# Patient Record
Sex: Female | Born: 1960 | Race: Black or African American | Hispanic: No | Smoking: Current every day smoker
Health system: Southern US, Community
[De-identification: ages and names within clinical notes are randomized; demographics above are authoritative.]

## PROBLEM LIST (undated history)

## (undated) DIAGNOSIS — I10 Essential (primary) hypertension: Secondary | ICD-10-CM

## (undated) DIAGNOSIS — I509 Heart failure, unspecified: Secondary | ICD-10-CM

## (undated) HISTORY — PX: APPENDECTOMY: SHX54

---

## 1998-07-24 ENCOUNTER — Emergency Department (HOSPITAL_COMMUNITY): Admission: EM | Admit: 1998-07-24 | Discharge: 1998-07-24 | Payer: Self-pay | Admitting: Emergency Medicine

## 1998-10-01 ENCOUNTER — Emergency Department (HOSPITAL_COMMUNITY): Admission: EM | Admit: 1998-10-01 | Discharge: 1998-10-01 | Payer: Self-pay | Admitting: Emergency Medicine

## 1998-11-05 ENCOUNTER — Encounter: Admission: RE | Admit: 1998-11-05 | Discharge: 1998-11-05 | Payer: Self-pay | Admitting: Internal Medicine

## 1999-03-19 ENCOUNTER — Encounter: Payer: Self-pay | Admitting: *Deleted

## 1999-03-19 ENCOUNTER — Emergency Department (HOSPITAL_COMMUNITY): Admission: EM | Admit: 1999-03-19 | Discharge: 1999-03-19 | Payer: Self-pay | Admitting: Emergency Medicine

## 2000-12-27 ENCOUNTER — Encounter: Payer: Self-pay | Admitting: Emergency Medicine

## 2000-12-27 ENCOUNTER — Inpatient Hospital Stay (HOSPITAL_COMMUNITY): Admission: EM | Admit: 2000-12-27 | Discharge: 2000-12-28 | Payer: Self-pay | Admitting: Emergency Medicine

## 2000-12-27 ENCOUNTER — Encounter: Payer: Self-pay | Admitting: Internal Medicine

## 2001-01-03 ENCOUNTER — Encounter: Admission: RE | Admit: 2001-01-03 | Discharge: 2001-01-03 | Payer: Self-pay | Admitting: Hematology and Oncology

## 2001-01-14 ENCOUNTER — Emergency Department (HOSPITAL_COMMUNITY): Admission: EM | Admit: 2001-01-14 | Discharge: 2001-01-14 | Payer: Self-pay | Admitting: Emergency Medicine

## 2001-01-27 ENCOUNTER — Emergency Department (HOSPITAL_COMMUNITY): Admission: EM | Admit: 2001-01-27 | Discharge: 2001-01-27 | Payer: Self-pay | Admitting: Emergency Medicine

## 2001-01-28 ENCOUNTER — Encounter: Payer: Self-pay | Admitting: Emergency Medicine

## 2001-02-01 ENCOUNTER — Encounter: Admission: RE | Admit: 2001-02-01 | Discharge: 2001-02-01 | Payer: Self-pay | Admitting: Hematology and Oncology

## 2001-02-02 ENCOUNTER — Ambulatory Visit (HOSPITAL_COMMUNITY): Admission: RE | Admit: 2001-02-02 | Discharge: 2001-02-02 | Payer: Self-pay | Admitting: *Deleted

## 2001-02-23 ENCOUNTER — Encounter: Admission: RE | Admit: 2001-02-23 | Discharge: 2001-02-23 | Payer: Self-pay | Admitting: Obstetrics

## 2001-05-12 ENCOUNTER — Emergency Department (HOSPITAL_COMMUNITY): Admission: EM | Admit: 2001-05-12 | Discharge: 2001-05-12 | Payer: Self-pay | Admitting: Emergency Medicine

## 2001-06-16 ENCOUNTER — Emergency Department (HOSPITAL_COMMUNITY): Admission: EM | Admit: 2001-06-16 | Discharge: 2001-06-16 | Payer: Self-pay | Admitting: Emergency Medicine

## 2001-10-12 ENCOUNTER — Emergency Department (HOSPITAL_COMMUNITY): Admission: EM | Admit: 2001-10-12 | Discharge: 2001-10-12 | Payer: Self-pay | Admitting: Emergency Medicine

## 2003-01-06 ENCOUNTER — Emergency Department (HOSPITAL_COMMUNITY): Admission: EM | Admit: 2003-01-06 | Discharge: 2003-01-06 | Payer: Self-pay | Admitting: Emergency Medicine

## 2003-01-06 ENCOUNTER — Encounter: Payer: Self-pay | Admitting: Emergency Medicine

## 2003-03-17 ENCOUNTER — Emergency Department (HOSPITAL_COMMUNITY): Admission: EM | Admit: 2003-03-17 | Discharge: 2003-03-17 | Payer: Self-pay

## 2003-07-22 ENCOUNTER — Encounter: Payer: Self-pay | Admitting: *Deleted

## 2003-07-22 ENCOUNTER — Emergency Department (HOSPITAL_COMMUNITY): Admission: EM | Admit: 2003-07-22 | Discharge: 2003-07-22 | Payer: Self-pay | Admitting: Emergency Medicine

## 2004-12-23 ENCOUNTER — Emergency Department (HOSPITAL_COMMUNITY): Admission: EM | Admit: 2004-12-23 | Discharge: 2004-12-23 | Payer: Self-pay | Admitting: Emergency Medicine

## 2005-02-02 ENCOUNTER — Ambulatory Visit: Payer: Self-pay | Admitting: Internal Medicine

## 2005-02-05 ENCOUNTER — Ambulatory Visit: Payer: Self-pay | Admitting: Family Medicine

## 2005-02-08 ENCOUNTER — Ambulatory Visit: Payer: Self-pay | Admitting: *Deleted

## 2005-02-26 ENCOUNTER — Ambulatory Visit: Payer: Self-pay | Admitting: Family Medicine

## 2006-08-08 ENCOUNTER — Emergency Department (HOSPITAL_COMMUNITY): Admission: EM | Admit: 2006-08-08 | Discharge: 2006-08-08 | Payer: Self-pay | Admitting: Emergency Medicine

## 2006-08-26 ENCOUNTER — Ambulatory Visit: Payer: Self-pay | Admitting: Family Medicine

## 2006-09-23 ENCOUNTER — Ambulatory Visit: Payer: Self-pay | Admitting: Family Medicine

## 2006-12-13 ENCOUNTER — Ambulatory Visit: Payer: Self-pay | Admitting: Family Medicine

## 2006-12-27 ENCOUNTER — Ambulatory Visit: Payer: Self-pay | Admitting: Family Medicine

## 2006-12-30 ENCOUNTER — Ambulatory Visit (HOSPITAL_COMMUNITY): Admission: RE | Admit: 2006-12-30 | Discharge: 2006-12-30 | Payer: Self-pay | Admitting: Family Medicine

## 2006-12-30 DIAGNOSIS — N83209 Unspecified ovarian cyst, unspecified side: Secondary | ICD-10-CM

## 2007-03-01 ENCOUNTER — Emergency Department (HOSPITAL_COMMUNITY): Admission: EM | Admit: 2007-03-01 | Discharge: 2007-03-01 | Payer: Self-pay | Admitting: Emergency Medicine

## 2007-03-03 ENCOUNTER — Ambulatory Visit: Payer: Self-pay | Admitting: Internal Medicine

## 2007-03-09 ENCOUNTER — Ambulatory Visit: Payer: Self-pay | Admitting: Internal Medicine

## 2007-07-27 ENCOUNTER — Telehealth (INDEPENDENT_AMBULATORY_CARE_PROVIDER_SITE_OTHER): Payer: Self-pay | Admitting: *Deleted

## 2007-07-28 DIAGNOSIS — I10 Essential (primary) hypertension: Secondary | ICD-10-CM | POA: Insufficient documentation

## 2007-07-28 DIAGNOSIS — F411 Generalized anxiety disorder: Secondary | ICD-10-CM | POA: Insufficient documentation

## 2007-08-01 ENCOUNTER — Ambulatory Visit: Payer: Self-pay | Admitting: Family Medicine

## 2007-08-03 DIAGNOSIS — L259 Unspecified contact dermatitis, unspecified cause: Secondary | ICD-10-CM

## 2007-08-16 ENCOUNTER — Encounter (INDEPENDENT_AMBULATORY_CARE_PROVIDER_SITE_OTHER): Payer: Self-pay | Admitting: *Deleted

## 2007-12-08 ENCOUNTER — Ambulatory Visit: Payer: Self-pay | Admitting: Nurse Practitioner

## 2007-12-08 DIAGNOSIS — I808 Phlebitis and thrombophlebitis of other sites: Secondary | ICD-10-CM

## 2007-12-13 ENCOUNTER — Emergency Department (HOSPITAL_COMMUNITY): Admission: EM | Admit: 2007-12-13 | Discharge: 2007-12-13 | Payer: Self-pay | Admitting: Emergency Medicine

## 2008-03-28 ENCOUNTER — Telehealth (INDEPENDENT_AMBULATORY_CARE_PROVIDER_SITE_OTHER): Payer: Self-pay | Admitting: *Deleted

## 2008-04-16 ENCOUNTER — Telehealth (INDEPENDENT_AMBULATORY_CARE_PROVIDER_SITE_OTHER): Payer: Self-pay | Admitting: Nurse Practitioner

## 2009-05-07 ENCOUNTER — Telehealth (INDEPENDENT_AMBULATORY_CARE_PROVIDER_SITE_OTHER): Payer: Self-pay | Admitting: Internal Medicine

## 2009-05-20 ENCOUNTER — Ambulatory Visit: Payer: Self-pay | Admitting: Nurse Practitioner

## 2009-05-20 DIAGNOSIS — IMO0002 Reserved for concepts with insufficient information to code with codable children: Secondary | ICD-10-CM

## 2009-05-20 DIAGNOSIS — F17200 Nicotine dependence, unspecified, uncomplicated: Secondary | ICD-10-CM

## 2009-05-20 LAB — CONVERTED CEMR LAB
ALT: 24 units/L (ref 0–35)
AST: 39 units/L — ABNORMAL HIGH (ref 0–37)
Albumin: 3.9 g/dL (ref 3.5–5.2)
Alkaline Phosphatase: 92 units/L (ref 39–117)
BUN: 6 mg/dL (ref 6–23)
Basophils Absolute: 0 10*3/uL (ref 0.0–0.1)
Calcium: 9 mg/dL (ref 8.4–10.5)
Cholesterol: 203 mg/dL — ABNORMAL HIGH (ref 0–200)
Creatinine, Ser: 0.74 mg/dL (ref 0.40–1.20)
HCT: 38 % (ref 36.0–46.0)
HDL: 92 mg/dL (ref >39)
Hemoglobin: 11.8 g/dL — ABNORMAL LOW (ref 12.0–15.0)
Lymphs Abs: 1.3 10*3/uL (ref 0.7–4.0)
Neutro Abs: 2.7 10*3/uL (ref 1.7–7.7)
Neutrophils Relative %: 55 % (ref 43–77)
Potassium: 4.6 meq/L (ref 3.5–5.3)
RBC: 4.53 M/uL (ref 3.87–5.11)
RDW: 15.5 % (ref 11.5–15.5)
TSH: 1.619 microintl units/mL (ref 0.350–4.500)
Total Bilirubin: 0.3 mg/dL (ref 0.3–1.2)
Triglycerides: 130 mg/dL (ref <150)
VLDL: 26 mg/dL (ref 0–40)
WBC: 4.9 10*3/uL (ref 4.0–10.5)

## 2009-05-21 ENCOUNTER — Encounter (INDEPENDENT_AMBULATORY_CARE_PROVIDER_SITE_OTHER): Payer: Self-pay | Admitting: Nurse Practitioner

## 2010-02-13 ENCOUNTER — Ambulatory Visit: Payer: Self-pay | Admitting: Nurse Practitioner

## 2010-02-13 LAB — CONVERTED CEMR LAB
Glucose, Urine, Semiquant: NEGATIVE
Rapid HIV Screen: NEGATIVE
Urobilinogen, UA: 0.2

## 2010-02-16 DIAGNOSIS — D649 Anemia, unspecified: Secondary | ICD-10-CM | POA: Insufficient documentation

## 2010-02-16 LAB — CONVERTED CEMR LAB
Basophils Relative: 1 % (ref 0–1)
CO2: 23 meq/L (ref 19–32)
Calcium: 8.7 mg/dL (ref 8.4–10.5)
HCT: 35.7 % — ABNORMAL LOW (ref 36.0–46.0)
HDL: 78 mg/dL (ref >39)
Hemoglobin: 11.1 g/dL — ABNORMAL LOW (ref 12.0–15.0)
LDL Cholesterol: 90 mg/dL (ref 0–99)
Lymphocytes Relative: 37 % (ref 12–46)
Lymphs Abs: 1.6 10*3/uL (ref 0.7–4.0)
MCV: 82.3 fL (ref 78.0–100.0)
Monocytes Absolute: 0.3 10*3/uL (ref 0.1–1.0)
Neutro Abs: 2.2 10*3/uL (ref 1.7–7.7)
Platelets: 167 10*3/uL (ref 150–400)
Potassium: 4.3 meq/L (ref 3.5–5.3)
RBC: 4.34 M/uL (ref 3.87–5.11)
Sodium: 140 meq/L (ref 135–145)
Total CHOL/HDL Ratio: 2.4
Triglycerides: 94 mg/dL (ref <150)
VLDL: 19 mg/dL (ref 0–40)
WBC: 4.3 10*3/uL (ref 4.0–10.5)

## 2010-02-27 ENCOUNTER — Ambulatory Visit: Payer: Self-pay | Admitting: Nurse Practitioner

## 2010-05-13 ENCOUNTER — Ambulatory Visit: Payer: Self-pay | Admitting: Nurse Practitioner

## 2010-05-13 LAB — CONVERTED CEMR LAB
GC Probe Amp, Genital: NEGATIVE
Glucose, Urine, Semiquant: NEGATIVE
KOH Prep: NEGATIVE
Ketones, urine, test strip: NEGATIVE
Protein, U semiquant: NEGATIVE
Specific Gravity, Urine: <1.005
WBC Urine, dipstick: NEGATIVE
pH: 5

## 2010-05-19 ENCOUNTER — Encounter (INDEPENDENT_AMBULATORY_CARE_PROVIDER_SITE_OTHER): Payer: Self-pay | Admitting: Nurse Practitioner

## 2010-05-19 LAB — CONVERTED CEMR LAB: Pap Smear: NEGATIVE

## 2010-05-28 ENCOUNTER — Ambulatory Visit (HOSPITAL_COMMUNITY): Admission: RE | Admit: 2010-05-28 | Discharge: 2010-05-28 | Payer: Self-pay | Admitting: Internal Medicine

## 2010-06-05 ENCOUNTER — Encounter (INDEPENDENT_AMBULATORY_CARE_PROVIDER_SITE_OTHER): Payer: Self-pay | Admitting: Nurse Practitioner

## 2010-06-16 ENCOUNTER — Encounter: Admission: RE | Admit: 2010-06-16 | Discharge: 2010-06-16 | Payer: Self-pay | Admitting: Internal Medicine

## 2010-12-29 NOTE — Assessment & Plan Note (Signed)
Summary: Complete Physical Exam   Vital Signs:  Patient profile:   50 year old female Menstrual status:  4 years ago Weight:      218.0 pounds BMI:     38.15 Temp:     98.0 degrees F oral Pulse rate:   92 / minute Pulse rhythm:   regular Resp:     20 per minute BP sitting:   154 / 103  (left arm) Cuff size:   regular  Vitals Entered By: Levon Hedger (May 13, 2010 2:41 PM)  Serial Vital Signs/Assessments:  Time      Position  BP       Pulse  Resp  Temp     By                     150/101  89                    Levon Hedger  CC: CPP, Hypertension Management Is Patient Diabetic? No Pain Assessment Patient in pain? no       Does patient need assistance? Functional Status Self care Ambulation Normal   Primary Care Provider:  Lehman Prom FNP  CC:  CPP and Hypertension Management.  History of Present Illness:  Pt into the office for complete physical exam  PAP - last pap smear several years ago All previous PAP Smears have been normal. 1 child age 69 Menses - post menopausal; lmp x 5 years ago  MAmmogram - no history of mammgram No self breast exams at home sister with breast cancer (younger)  Optho - no glasses or contacts.  last eye exam several years ago  Dental - no recent dental exam.  no current problems with teeth.  Social - pt is employed at [employer] in [employer]  tdap - last done over 10 years ago  Hypertension History:      She denies headache, chest pain, and palpitations.  She notes no problems with any antihypertensive medication side effects.  Pt is taking norvasc and toprol daily as ordered - she presents today with the meds.        Positive major cardiovascular risk factors include hypertension and current tobacco user.  Negative major cardiovascular risk factors include female age less than 23 years old.        Further assessment for target organ damage reveals no history of ASHD, cardiac end-organ damage (CHF/LVH), stroke/TIA,  peripheral vascular disease, renal insufficiency, or hypertensive retinopathy.     Habits & Providers  Alcohol-Tobacco-Diet     Alcohol drinks/day: 1     Alcohol Counseling: not indicated; use of alcohol is not excessive or problematic     Alcohol type: beer     Tobacco Status: current     Tobacco Counseling: to quit use of tobacco products     Cigarette Packs/Day: <0.25     Year Started: age 84  Exercise-Depression-Behavior     Does Patient Exercise: yes     Exercise Counseling: not indicated; exercise is adequate     Type of exercise: walking     Exercise (avg: min/session): <30     Times/week: 3     Have you felt down or hopeless? no     Have you felt little pleasure in things? no     Depression Counseling: not indicated; screening negative for depression     Drug Use: never  Comments: PHQ- 9 score = 9  Medications Prior to Update: 1)  Norvasc 10 Mg  Tabs (Amlodipine Besylate) .... Take 1 Tablet By Mouth Every Morning 2)  Toprol Xl 25 Mg Tb24 (Metoprolol Succinate) .... One Tablet By Mouth Daily For Blood Pressure 3)  Diprolene Af 0.05 %  Crea (Aug Betamethasone Dipropionate) .... Apply To Affected Areas Three Times A Day 4)  Multivitamins  Caps (Multiple Vitamin) .... One Capsule By Mouth Daily  Allergies (verified): 1)  ! Lotensin  Review of Systems General:  Denies fever. Eyes:  Denies blurring. ENT:  Denies earache. CV:  Denies chest pain or discomfort. Resp:  Denies cough. GI:  Denies abdominal pain, nausea, and vomiting. GU:  Denies dysuria. MS:  Denies joint pain. Derm:  Denies rash. Neuro:  Denies headaches. Psych:  Denies anxiety and depression.  Physical Exam  General:  alert.   Head:  normocephalic.   Eyes:  pupils round.   Ears:  R ear normal and L ear normal.  Bil TM with bony landmarks Nose:  no nasal discharge.   Mouth:  pharynx pink and moist and fair dentition.   Neck:  supple.   Chest Wall:  no mass.   Breasts:  pendulous no masses and  no abnormal thickening.   Lungs:  normal breath sounds.   Heart:  normal rate and regular rhythm.   Abdomen:  normal bowel sounds.   Rectal:  no external abnormalities.   Msk:  up to the exam room Pulses:  R radial normal and L radial normal.   Extremities:  no edema Neurologic:  gait normal.   Skin:  discolored toenails Psych:  Oriented X3.    Pelvic Exam  Vulva:      normal appearance.   Urethra and Bladder:      Urethra--no discharge.   Vagina:      physiologic discharge.   Cervix:      midposition.   Uterus:      smooth.   Adnexa:      nontender bilaterally.   Rectum:      heme negative stool.      Impression & Recommendations:  Problem # 1:  ROUTINE GYNECOLOGICAL EXAMINATION (ICD-V72.31) labs reviewed from previous visit PAP done PHQ-9 score = 9 EKG done guaiac negative tdap given Orders: UA Dipstick w/o Micro (manual) ([address]) KOH/ WET Mount [phone]) Pap Smear, Thin Prep ( Collection of) (Q0091) T- GC Chlamydia ([address]) Hemoccult Guaiac-1 spec.(in office) (82270)  Problem # 2:  UNSPECIFIED BREAST SCREENING (ICD-V76.10) self breast exam reviewed with pt mammogram scheduled sister with hx of breast cancer Orders: Mammogram (Screening) (Mammo)  Problem # 3:  HYPERTENSION (ICD-401.9) still elevated - will need to increase dose of toprol to 50mg   Her updated medication list for this problem includes:    Norvasc 10 Mg Tabs (Amlodipine besylate) .Marland Kitchen... Take 1 tablet by mouth every morning    Toprol Xl 50 Mg Xr24h-tab (Metoprolol succinate) ..... One tablet by mouth daily for blood pressure **note change in dose**  Orders: EKG w/ Interpretation (93000)  Problem # 4:  ANEMIA (ICD-285.9) noted during last visit pt has stated iron tablets as ordered  Complete Medication List: 1)  Norvasc 10 Mg Tabs (Amlodipine besylate) .... Take 1 tablet by mouth every morning 2)  Toprol Xl 50 Mg Xr24h-tab (Metoprolol succinate) .... One tablet by mouth daily for blood  pressure **note change in dose** 3)  Diprolene Af 0.05 % Crea (Aug betamethasone dipropionate) .... Apply to affected areas three times a day 4)  Multivitamins Caps (Multiple vitamin) .... One  capsule by mouth daily  Other Orders: Tdap => 14yrs IM ([address]) Admin 1st Vaccine (60[address]) Admin 1st Vaccine [facility]) [phone])  Hypertension Assessment/Plan:      The patient's hypertensive risk group is category B: At least one risk factor (excluding diabetes) with no target organ damage.  Her calculated 10 year risk of coronary heart disease is 5 %.  Today's blood pressure is 154/103.  Her blood pressure goal is < 140/90.  Patient Instructions: 1)  Blood pressure - still elevated 2)  Increase toprol to 50mg  by mouth daily 3)  (you can take 2 of the tablets by mouth daily until you complete the supply) 4)  Continue to take norvasc 5)  Follow up in 1 month for blood pressure check. 6)  Take your medications before this visit Prescriptions: TOPROL XL 50 MG XR24H-TAB (METOPROLOL SUCCINATE) One tablet by mouth daily for blood pressure **note change in dose**  #30 x 2   Entered and Authorized by:   Lehman Prom FNP   Signed by:   Lehman Prom FNP on 05/13/2010   Method used:   Faxed to ...       [facility] (retail)       [address] [place], [place]  [address]       Ph: [phone]       Fax: [phone]   RxID:   [id number]   Prevention & Chronic Care Immunizations   Influenza vaccine: Not documented    Tetanus booster: 05/13/2010: Tdap   Tetanus booster due: 05/13/2020    Pneumococcal vaccine: Not documented  Other Screening   Pap smear: Not documented   Pap smear action/deferral: Ordered  (05/13/2010)   Pap smear due: 05/14/2011    Mammogram: Not documented   Mammogram action/deferral: Ordered  (05/13/2010)   Smoking status: current  (05/13/2010)   Smoking cessation counseling: no  (05/13/2010)  Lipids   Total  Cholesterol: 187  (02/13/2010)   LDL: 90  (02/13/2010)   LDL Direct: Not documented   HDL: 78  (02/13/2010)   Triglycerides: 94  (02/13/2010)  Hypertension   Last Blood Pressure: 154 / 103  (05/13/2010)   Serum creatinine: 0.63  (02/13/2010)   Serum potassium 4.3  (02/13/2010)  Self-Management Support :    Hypertension self-management support: Not documented   Nursing Instructions: Give tetanus booster today     EKG  Procedure date:  05/13/2010  Findings:      NSR LVH   Tetanus/Td Vaccine    Vaccine Type: Tdap    Site: left deltoid    Mfr: Sanofi Pasteur    Dose: 0.5 ml    Route: IM    Given by: Levon Hedger    Exp. Date: 09/30/2012    Lot #: [id number]    VIS given: 10/17/07 version given May 13, 2010.   Laboratory Results   Urine Tests  Date/Time Received: May 13, 2010 3:53 PM  Date/Time Reported: May 13, 2010 3:53 PM   Routine Urinalysis   Color: lt. yellow Appearance: Clear Glucose: negative   (Normal Range: Negative) Bilirubin: negative   (Normal Range: Negative) Ketone: negative   (Normal Range: Negative) Spec. Gravity: <1.005   (Normal Range: 1.003-1.035) Blood: trace-intact   (Normal Range: Negative) pH: 5.0   (Normal Range: 5.0-8.0) Protein: negative   (Normal Range: Negative) Urobilinogen: 0.2   (Normal Range: 0-1) Nitrite: negative   (Normal Range: Negative) Leukocyte Esterace: negative   (Normal Range: Negative)  Date/Time Received: May 13, 2010 4:02 PM   Wet Mount/KOH Source: vaginal WBC/hpf: 1-5 Bacteria/hpf: rare Clue cells/hpf: none Yeast/hpf: none Trichomonas/hpf: none  Stool - Occult Blood Hemmoccult #1: negative Date: 05/13/2010   Laboratory Results   Urine Tests    Routine Urinalysis   Color: lt. yellow Appearance: Clear Glucose: negative   (Normal Range: Negative) Bilirubin: negative   (Normal Range: Negative) Ketone: negative   (Normal Range: Negative) Spec. Gravity: <1.005   (Normal Range:  1.003-1.035) Blood: trace-intact   (Normal Range: Negative) pH: 5.0   (Normal Range: 5.0-8.0) Protein: negative   (Normal Range: Negative) Urobilinogen: 0.2   (Normal Range: 0-1) Nitrite: negative   (Normal Range: Negative) Leukocyte Esterace: negative   (Normal Range: Negative)      Wet Mount Wet Mount KOH: Negative  Stool - Occult Blood Hemmoccult #1: negative

## 2010-12-29 NOTE — Progress Notes (Signed)
Summary: Office Visit//DEPRESSION SCREENNING  Office Visit//DEPRESSION SCREENNING   Imported By: Arta Bruce 05/14/2010 10:31:15  _____________________________________________________________________  External Attachment:    Type:   Image     Comment:   External Document

## 2010-12-29 NOTE — Assessment & Plan Note (Signed)
Summary: HTN   Vital Signs:  Patient profile:   50 year old female Menstrual status:  4 years ago Weight:      218.9 pounds BMI:     38.31 BSA:     2.02 Temp:     98.2 degrees F oral Pulse rate:   99 / minute Pulse rhythm:   regular Resp:     20 per minute BP sitting:   141 / 96  (left arm) Cuff size:   regular  Vitals Entered By: Levon Hedger (February 13, 2010 12:22 PM) CC: renew medication, Hypertension Management Is Patient Diabetic? No Pain Assessment Patient in pain? no       Does patient need assistance? Functional Status Self care Ambulation Normal   Primary Care Provider:  Julieanne Manson MD  CC:  renew medication and Hypertension Management.  History of Present Illness:  Pt into the office for high blood pressure. Pt has not taken her medications in over 6 months.  Social - Pt is employed in [employer] at [employer]. Admits that she has changed her diet to decrease processed food and she has also startded to walk and exercise.  No acute complaints today  Hypertension History:      She denies headache, chest pain, and palpitations.  She notes no problems with any antihypertensive medication side effects.  pt has not taken her medications in over 6 months.        Positive major cardiovascular risk factors include hypertension and current tobacco user.  Negative major cardiovascular risk factors include female age less than 45 years old.        Further assessment for target organ damage reveals no history of ASHD, cardiac end-organ damage (CHF/LVH), stroke/TIA, peripheral vascular disease, renal insufficiency, or hypertensive retinopathy.     Habits & Providers  Alcohol-Tobacco-Diet     Alcohol drinks/day: 1     Alcohol Counseling: not indicated; use of alcohol is not excessive or problematic     Alcohol type: beer     Tobacco Status: current     Tobacco Counseling: to quit use of tobacco products     Cigarette Packs/Day: <0.25     Year Started: age  12  Exercise-Depression-Behavior     Does Patient Exercise: yes     Exercise Counseling: not indicated; exercise is adequate     Type of exercise: walking     Exercise (avg: min/session): <30     Times/week: 3     Drug Use: never  Medications Prior to Update: 1)  Norvasc 10 Mg  Tabs (Amlodipine Besylate) .... Take 1 Tablet By Mouth Every Morning 2)  Toprol Xl 25 Mg Tb24 (Metoprolol Succinate) .... One Tablet By Mouth Daily For Blood Pressure 3)  Diprolene Af 0.05 %  Crea (Aug Betamethasone Dipropionate) .... Apply To Affected Areas Three Times A Day  Allergies (verified): 1)  ! Lotensin  Review of Systems General:  Denies fever. CV:  Denies chest pain or discomfort. Resp:  Denies cough. GI:  Denies abdominal pain, nausea, and vomiting.  Physical Exam  General:  alert.   Lungs:  normal breath sounds.   Heart:  normal rate and regular rhythm.   Abdomen:  normal bowel sounds.   Msk:  up to the exam table Neurologic:  alert & oriented X3.   Skin:  color normal.   Psych:  Oriented X3.     Impression & Recommendations:  Problem # 1:  HYPERTENSION (ICD-401.9) Continue DASH diet will restart norvasc 10mg   by mouth daily Her updated medication list for this problem includes:    Norvasc 10 Mg Tabs (Amlodipine besylate) .Marland Kitchen... Take 1 tablet by mouth every morning    Toprol Xl 25 Mg Tb24 (Metoprolol succinate) ..... Hold  Orders: T-Lipid Profile [phone]) T-Comprehensive Metabolic Panel [phone]) T-CBC w/Diff [phone]) Rapid HIV  ([address]) T-TSH [phone]) T-Syphilis Test (RPR) [phone]) T-Urine Microalbumin w/creat. ratio [phone]) UA Dipstick w/o Micro (manual) ([address])  Problem # 2:  TOBACCO ABUSE (ICD-305.1) advised cessation  Complete Medication List: 1)  Norvasc 10 Mg Tabs (Amlodipine besylate) .... Take 1 tablet by mouth every morning 2)  Toprol Xl 25 Mg Tb24 (Metoprolol succinate) .... Hold 3)  Diprolene Af 0.05 % Crea (Aug betamethasone  dipropionate) .... Apply to affected areas three times a day  Hypertension Assessment/Plan:      The patient's hypertensive risk group is category B: At least one risk factor (excluding diabetes) with no target organ damage.  Her calculated 10 year risk of coronary heart disease is 4 %.  Today's blood pressure is 141/96.  Her blood pressure goal is < 140/90.  Patient Instructions: 1)  Since your blood pressure is doing well you can restart on the norvasc 10mg .  Pick this up from the pharmacy on next week. 2)  Keep the toprol on hold at the pharmacy - you will not need it until told after your blood pressure check. 3)  Keep up the good work with diet and exercise as this is doing well for your blood pressure 4)  Follow up in 2 weeks for nurse visit - blood pressure check. 5)  If blood pressure is still ok then you can keep taking the norvasc. 6)  Schedule appointment in 6 weeks for a complete physical exam. Prescriptions: TOPROL XL 25 MG TB24 (METOPROLOL SUCCINATE) One tablet by mouth daily for blood pressure  #30 x 3   Entered and Authorized by:   Lehman Prom FNP   Signed by:   Lehman Prom FNP on 02/13/2010   Method used:   Faxed to ...       [facility] (retail)       [address] [place], [place]  [address]       Ph: [phone]       Fax: [phone]   RxID:   [id number] NORVASC 10 MG  TABS (AMLODIPINE BESYLATE) Take 1 tablet by mouth every morning  #30 x 3   Entered and Authorized by:   Lehman Prom FNP   Signed by:   Lehman Prom FNP on 02/13/2010   Method used:   Faxed to ...       [facility] (retail)       [address] [place], [place]  [address]       Ph: [phone]       Fax: [phone]   RxID:   [id number]   Laboratory Results   Urine Tests  Date/Time Received: February 13, 2010 12:46 PM  Date/Time Reported: February 13, 2010 12:46 PM   Routine  Urinalysis   Color: lt. yellow Appearance: Clear Glucose: negative   (Normal Range: Negative) Bilirubin: negative   (Normal Range: Negative) Ketone: negative   (Normal Range: Negative) Spec. Gravity: <1.005   (Normal Range: 1.003-1.035) Blood: negative   (Normal Range: Negative) pH: 5.5   (Normal Range: 5.0-8.0) Protein: negative   (Normal Range: Negative) Urobilinogen: 0.2   (  Normal Range: 0-1) Nitrite: negative   (Normal Range: Negative) Leukocyte Esterace: negative   (Normal Range: Negative)    Date/Time Received: February 13, 2010 12:57 PM   Other Tests  Rapid HIV: negative

## 2010-12-29 NOTE — Letter (Signed)
Summary: *HSN Results Follow up  [facility]  [address] [place], [place] [address]   Phone: [phone]  Fax: [phone]      05/19/2010   Patricia Mejia [address] [place], [place]  [address]   Dear  [name],                            ____S.Drinkard,FNP   ____D. Gore,FNP       ____B. McPherson,MD   ____V. Rankins,MD    ____E. Mulberry,MD    __X__N. Daphine Deutscher, FNP  ____D. Reche Dixon, MD    ____K. Philipp Deputy, MD    ____Other     This letter is to inform you that your recent test(s):  ___X____Pap Smear    _______Lab Test     _______X-ray    ___X____ is within acceptable limits  _______ requires a medication change  _______ requires a follow-up lab visit  _______ requires a follow-up visit with your provider   Comments: Pap Smear results are normal.       _________________________________________________________ If you have any questions, please contact our office [phone].                    Sincerely,    Lehman Prom FNP [facility]

## 2011-01-28 ENCOUNTER — Telehealth (INDEPENDENT_AMBULATORY_CARE_PROVIDER_SITE_OTHER): Payer: Self-pay | Admitting: Nurse Practitioner

## 2011-01-28 ENCOUNTER — Inpatient Hospital Stay (INDEPENDENT_AMBULATORY_CARE_PROVIDER_SITE_OTHER)
Admission: RE | Admit: 2011-01-28 | Discharge: 2011-01-28 | Disposition: A | Discharge status: Home / Self Care | Payer: Self-pay | Source: Ambulatory Visit | Attending: Family Medicine | Admitting: Family Medicine

## 2011-01-28 DIAGNOSIS — K5289 Other specified noninfective gastroenteritis and colitis: Secondary | ICD-10-CM

## 2011-01-28 DIAGNOSIS — R112 Nausea with vomiting, unspecified: Secondary | ICD-10-CM

## 2011-02-04 NOTE — Progress Notes (Signed)
Summary: Need note for work today  Phone Note Call from Patient Call back at [facility] Phone [phone]   Reason for Call: Talk to Nurse Summary of Call: Pt said she didn't go to work yesterday because she had like a 24 hr virus and her job told her she needs a note today to come to work.  Pt goes to work at [employer] today. pls call asap so pt can stop by to get note and go to work. Initial call taken by: Ayesha Rumpf,  January 28, 2011 9:33 AM  Follow-up for Phone Call        Pt. advised that we cannot give her a work excuse because she was not seen in our office.  Pt. verbalized understanding.  Dutch Quint RN  January 28, 2011 9:36 AM

## 2011-04-16 NOTE — Discharge Summary (Signed)
[place]  Patient:    Patricia Mejia, Patricia Mejia                      MRN: [id number] Adm. Date:  [id number] Disc. Date: [id number] Attending:  Farley Mejia Dictator:   Patricia Mejia, M.D. CC:         Patricia Mejia, M.D.  Patricia Mejia, M.D.  Patricia Mejia, M.D.   Discharge Summary  DISCHARGE DIAGNOSES: 1. Anemia secondary to uterine fibroids and excessive menstrual bleeding. 2. Hypertension.  DISCHARGE MEDICATIONS: 1. HCTZ 25 mg p.o. q.d. 2. Iron 325 mg p.o. t.i.d.  FOLLOW-UP APPOINTMENT:  1. Patricia Mejia was to follow up with the outpatient clinic on Tuesday, January 03, 2001, for recheck of a CBC.  2. She was to follow up with the OB/GYN clinic on Tuesday, January 10, 2001, at 2 p.m. for further evaluation and work-up of her anemia and fibroids.  3. She was to follow up with Patricia Mejia, M.D., on February 01, 2001, at 9 a.m. for regular medical issue follow-up.  PROCEDURES:  Ultrasound of the pelvis and transvaginal ultrasound were done on December 27, 2000, and revealed a moderately enlarged fibroid uterus, a normal left ovary, and two abutting complex cystic lesions of the right ovary with a moderate amount of fluid in the cul-de-sac.  CONSULTATIONS:  None.  CHIEF COMPLAINT:  Shortness of breath and palpitations.  HISTORY OF PRESENT ILLNESS:  This is a 50 year old African-American female with a past medical history of hypertension and fibroids, who presents with a one-day history of shortness of breath, dizziness, and palpitations. Patricia Mejia reports that she awoke suddenly last night, feeling short of breath, dizzy, and felt that her heart was racing.  She denies a prior history of similar symptoms.  She presented to the ER, was transfused, and reports a decrease in her symptoms since transfusion.  She does still complain of some shortness of breath and intermittent palpitations and did have an episode of chills with  transfusion.  She does state a history of increased menstrual bleeding for the past three to four years.  She states that her period occur approximately one time per month, but last about nine days.  She reports that it is heavy through the sixth day and that she uses about a pack of pads per day.  She denies pain.  REVIEW OF SYSTEMS:  Negative for fevers or chills, double or blurred vision, hearing changes, chest pain, nausea, vomiting, diarrhea, constipation, bright red blood per rectum, melena, and dysuria.  The review of systems is positive for a nonproductive cough x 2 days.  PAST MEDICAL HISTORY:  1. Hypertension.  2. Uterine fibroids.  PAST SURGICAL HISTORY:  Appendectomy.  ALLERGIES:  No known drug allergies.  MEDICATIONS:  Hydrochlorothiazide 25 mg q.d. (stopped four months ago).  SOCIAL HISTORY:  Pat[place] with her sister, Patricia Mejia.  She is separated with one daughter.  She smokes tobacco prior to quitting three days ago, smoking one pack a day x 15 years.  She drinks about two cans of beer a day.  She denies illicit drug use.  FAMILY MEDICAL HISTORY:  Patricia Mejia died with an MI at 50 years of age.  Patricia Mejia also had hypertension.  Patricia Mejia is alive at 50 years of age, status post CVA.  He also has diabetes.  Patricia Mejia has three sisters, one with breast cancer.  PHYSICAL EXAMINATION:  On admission, vital signs  were temperature 99.3 degrees, pulse 108, respiratory rate 22, BP supine 155/92 and pulse 113, BP sitting 150/87 and pulse 111, and BP standing 149/89 and pulse 116.  On the floor, vital signs were temperature 100.3 degrees, BP 160/90, respiratory rate 20, saturations 95% on room air, and pulse 119.  GENERAL APPEARANCE:  This is a pleasant African-American female in no acute distress.  HEENT:  Pupils are equally, round, and reactive to light.  Extraocular muscles intact.  Oropharynx clear without erythema or exudate.  Conjunctivae  pale bilaterally.  NECK:  No cervical lymphadenopathy.  CARDIOVASCULAR:  Regular rate and rhythm.  Tachycardic with a 3/6 systolic ejection murmur heard throughout at the left and right second intercostal spaces, fourth left intercostal space, and the apex.  RESPIRATORY:  Clear to auscultation bilaterally.  No rhonchi, rales, or wheezes.  ABDOMEN:  Bowel sounds present.  Soft, nontender, nondistended.  No hepatosplenomegaly.  EXTREMITIES:  No edema in the lower extremities bilaterally.  Peripheral pulses bilaterally.  NEUROLOGIC:  Cranial nerves II-XII grossly intact.  No focal deficits.  ADMISSION LABORATORY DATA:  CBC:  White count 5.9, hemoglobin 7.2, platelets 191, MCV 63.8, 68 neutrophils, 18 lymphs, ANC 4.1.  BMP: Sodium 139, potassium 3.8, chloride 106, bicarbonate 20, BUN 5, creatinine 0.7, glucose 105.  A pH of 7.468 and pCO2 27.4.  Ferritin 7, iron 17, TIBC 316, percent saturation 5.  Chest x-ray:  No active disease.  EKG:  Sinus tachycardia, no ischemic changes.  Ultrasound of the pelvis:  See above in procedure section.  HOSPITAL COURSE: #1 - ANEMIA:  Patricia Mejia came to the ER complaining of shortness of breath, palpitations, and dizziness.  She was noted to have a hemoglobin of 7.2. Because she was symptomatic, she was transfused two units of packed red blood cells.  The hemoglobin was noted to improve to 9.2 and remained stable prior to discharge at a level of 9.1.  A transvaginal and pelvic ultrasound were done which revealed moderately large fibroid uterus, as well as cystic right ovary.  In this young, menstruating woman with fibroids and a ferritin of 7, clearly the most obvious source for her anemia is menstrual bleeding. Therefore, after a negative serum pregnancy test was obtained, Patricia Mejia was given a shot of Depo-Provera 400 mg IM to decrease or to stop her menstrual  bleeding.  She was also started on iron therapy 325 mg p.o. t.i.d.  Finally,  a follow-up appointment with the OB/GYN clinic was arranged for Tuesday, January 10, 2001, for further follow-up and evaluation of her fibroids and ovarian cyst.  Another shot of Depo-Provera may be necessary at that time in order to prevent further menstrual bleeding and allow Ms. Pettie to build her hemoglobin back up over time.  A recheck of Ms. Murphys CBC was arranged for January 03, 2001, prior to her follow-up with OB/GYN just to ensure that her hemoglobin level was not trending down again rapidly.  #2 - HYPERTENSION:  Ms. Titsworth was noted to be hypertension on admission at a blood pressure of 160/90 on arrival to the floor.  Her hydrochlorothiazide was restarted as she had discontinued it approximately four months prior.  On HCTZ, her blood pressure was noted to be well controlled.  This will need to be followed further as an outpatient and an additional agent may be necessary if elevation of her blood pressure is noted.  A follow-up appointment was arranged for general medical issues on February 01, 2001, with Patricia Mejia, M.D.  DISCHARGE LABORATORY DATA:  CBC:  White count 6.8, hemoglobin 9.1, hematocrit 30.4, platelets 166.  BMP:  Sodium 131, potassium 3.4, chloride 102, bicarbonate 23, BUN 6, creatinine 0.8, platelets 101, AST 32, ALT 29, alkaline phosphatase 65, total bilirubin 1.0, calcium 9.1.  DISPOSITION:  Ms. Cavenaugh was discharged to home in good condition. DD:  01/02/01 TD:  01/03/01 Job: [id number]

## 2011-11-09 ENCOUNTER — Other Ambulatory Visit: Payer: Self-pay | Admitting: Internal Medicine

## 2011-11-09 DIAGNOSIS — M25532 Pain in left wrist: Secondary | ICD-10-CM

## 2011-11-16 ENCOUNTER — Ambulatory Visit (HOSPITAL_COMMUNITY)
Admission: RE | Admit: 2011-11-16 | Discharge: 2011-11-16 | Disposition: A | Discharge status: Home / Self Care | Payer: Self-pay | Source: Ambulatory Visit | Attending: Internal Medicine | Admitting: Internal Medicine

## 2011-11-16 DIAGNOSIS — M25532 Pain in left wrist: Secondary | ICD-10-CM

## 2012-01-19 ENCOUNTER — Other Ambulatory Visit: Payer: Self-pay | Admitting: Internal Medicine

## 2012-01-19 DIAGNOSIS — M25532 Pain in left wrist: Secondary | ICD-10-CM

## 2012-01-21 ENCOUNTER — Other Ambulatory Visit (HOSPITAL_COMMUNITY): Payer: Self-pay

## 2012-08-01 ENCOUNTER — Emergency Department (HOSPITAL_COMMUNITY): Payer: Self-pay

## 2012-08-01 ENCOUNTER — Encounter (HOSPITAL_COMMUNITY): Payer: Self-pay | Admitting: *Deleted

## 2012-08-01 ENCOUNTER — Emergency Department (INDEPENDENT_AMBULATORY_CARE_PROVIDER_SITE_OTHER): Payer: Self-pay

## 2012-08-01 ENCOUNTER — Observation Stay (HOSPITAL_COMMUNITY)
Admission: EM | Admit: 2012-08-01 | Discharge: 2012-08-03 | DRG: 292 | Disposition: A | Discharge status: Home / Self Care | Payer: 59 | Attending: Internal Medicine | Admitting: Internal Medicine

## 2012-08-01 ENCOUNTER — Emergency Department (INDEPENDENT_AMBULATORY_CARE_PROVIDER_SITE_OTHER)
Admission: EM | Admit: 2012-08-01 | Discharge: 2012-08-01 | Disposition: A | Discharge status: Nursing Home (Includes ICF) | Payer: Self-pay | Source: Home / Self Care | Attending: Family Medicine | Admitting: Family Medicine

## 2012-08-01 ENCOUNTER — Encounter (HOSPITAL_COMMUNITY): Payer: Self-pay | Admitting: Emergency Medicine

## 2012-08-01 DIAGNOSIS — R609 Edema, unspecified: Secondary | ICD-10-CM

## 2012-08-01 DIAGNOSIS — I161 Hypertensive emergency: Secondary | ICD-10-CM

## 2012-08-01 DIAGNOSIS — E669 Obesity, unspecified: Secondary | ICD-10-CM

## 2012-08-01 DIAGNOSIS — L259 Unspecified contact dermatitis, unspecified cause: Secondary | ICD-10-CM

## 2012-08-01 DIAGNOSIS — D649 Anemia, unspecified: Secondary | ICD-10-CM

## 2012-08-01 DIAGNOSIS — F17200 Nicotine dependence, unspecified, uncomplicated: Secondary | ICD-10-CM

## 2012-08-01 DIAGNOSIS — F3160 Bipolar disorder, current episode mixed, unspecified: Secondary | ICD-10-CM

## 2012-08-01 DIAGNOSIS — Z23 Encounter for immunization: Secondary | ICD-10-CM

## 2012-08-01 DIAGNOSIS — I808 Phlebitis and thrombophlebitis of other sites: Secondary | ICD-10-CM

## 2012-08-01 DIAGNOSIS — F411 Generalized anxiety disorder: Secondary | ICD-10-CM

## 2012-08-01 DIAGNOSIS — F329 Major depressive disorder, single episode, unspecified: Secondary | ICD-10-CM | POA: Diagnosis present

## 2012-08-01 DIAGNOSIS — I10 Essential (primary) hypertension: Secondary | ICD-10-CM

## 2012-08-01 DIAGNOSIS — I509 Heart failure, unspecified: Secondary | ICD-10-CM

## 2012-08-01 DIAGNOSIS — N83209 Unspecified ovarian cyst, unspecified side: Secondary | ICD-10-CM

## 2012-08-01 DIAGNOSIS — R45851 Suicidal ideations: Secondary | ICD-10-CM

## 2012-08-01 DIAGNOSIS — I160 Hypertensive urgency: Secondary | ICD-10-CM

## 2012-08-01 DIAGNOSIS — I9589 Other hypotension: Secondary | ICD-10-CM | POA: Diagnosis not present

## 2012-08-01 DIAGNOSIS — Z79899 Other long term (current) drug therapy: Secondary | ICD-10-CM

## 2012-08-01 DIAGNOSIS — R45850 Homicidal ideations: Secondary | ICD-10-CM

## 2012-08-01 DIAGNOSIS — IMO0002 Reserved for concepts with insufficient information to code with codable children: Secondary | ICD-10-CM

## 2012-08-01 DIAGNOSIS — Z7982 Long term (current) use of aspirin: Secondary | ICD-10-CM

## 2012-08-01 DIAGNOSIS — F1010 Alcohol abuse, uncomplicated: Secondary | ICD-10-CM

## 2012-08-01 DIAGNOSIS — I5031 Acute diastolic (congestive) heart failure: Principal | ICD-10-CM

## 2012-08-01 DIAGNOSIS — R600 Localized edema: Secondary | ICD-10-CM

## 2012-08-01 HISTORY — DX: Essential (primary) hypertension: I10

## 2012-08-01 LAB — BASIC METABOLIC PANEL WITH GFR
BUN: 4 mg/dL — ABNORMAL LOW (ref 6–23)
CO2: 23 meq/L (ref 19–32)
Calcium: 9.7 mg/dL (ref 8.4–10.5)
Chloride: 105 meq/L (ref 96–112)
Creatinine, Ser: 0.67 mg/dL (ref 0.50–1.10)
GFR calc Af Amer: >90 mL/min
GFR calc non Af Amer: >90 mL/min
Glucose, Bld: 117 mg/dL — ABNORMAL HIGH (ref 70–99)
Potassium: 4.8 meq/L (ref 3.5–5.1)
Sodium: 140 meq/L (ref 135–145)

## 2012-08-01 LAB — CBC WITH DIFFERENTIAL/PLATELET
Basophils Absolute: 0 10*3/uL (ref 0.0–0.1)
Basophils Relative: 1 % (ref 0–1)
Eosinophils Absolute: 0.2 10*3/uL (ref 0.0–0.7)
Eosinophils Relative: 4 % (ref 0–5)
MCH: 26.4 pg (ref 26.0–34.0)
MCV: 83.2 fL (ref 78.0–100.0)
Neutrophils Relative %: 56 % (ref 43–77)
Platelets: 182 10*3/uL (ref 150–400)
RDW: 13.6 % (ref 11.5–15.5)

## 2012-08-01 LAB — URINALYSIS, ROUTINE W REFLEX MICROSCOPIC
Bilirubin Urine: NEGATIVE
Glucose, UA: NEGATIVE mg/dL
Ketones, ur: NEGATIVE mg/dL
Leukocytes, UA: NEGATIVE
pH: 5.5 (ref 5.0–8.0)

## 2012-08-01 LAB — POCT I-STAT, CHEM 8
BUN: <3 mg/dL — ABNORMAL LOW (ref 6–23)
Calcium, Ion: 1.18 mmol/L (ref 1.12–1.23)
Chloride: 107 mEq/L (ref 96–112)
HCT: 45 % (ref 36.0–46.0)

## 2012-08-01 LAB — PROTIME-INR
INR: 0.94 (ref 0.00–1.49)
Prothrombin Time: 12.8 s (ref 11.6–15.2)

## 2012-08-01 LAB — HEPATIC FUNCTION PANEL
ALT: 27 U/L (ref 0–35)
AST: 43 U/L — ABNORMAL HIGH (ref 0–37)
Albumin: 3.7 g/dL (ref 3.5–5.2)
Alkaline Phosphatase: 106 U/L (ref 39–117)
Bilirubin, Direct: <0.1 mg/dL (ref 0.0–0.3)
Total Bilirubin: 0.3 mg/dL (ref 0.3–1.2)
Total Protein: 8.1 g/dL (ref 6.0–8.3)

## 2012-08-01 LAB — URINE MICROSCOPIC-ADD ON

## 2012-08-01 LAB — TROPONIN I: Troponin I: <0.3 ng/mL

## 2012-08-01 LAB — APTT: aPTT: 32 seconds (ref 24–37)

## 2012-08-01 LAB — PRO B NATRIURETIC PEPTIDE: Pro B Natriuretic peptide (BNP): 135.5 pg/mL — ABNORMAL HIGH (ref 0–125)

## 2012-08-01 MED ORDER — DEXTROSE-NACL 5-0.45 % IV SOLN
INTRAVENOUS | Status: DC
  Administered 2012-08-01 (×2): via INTRAVENOUS

## 2012-08-01 MED ORDER — ASPIRIN 81 MG PO CHEW
162.0000 mg | CHEWABLE_TABLET | Freq: Once | ORAL | Status: AC
  Administered 2012-08-01: 162 mg via ORAL
  Filled 2012-08-01: qty 2

## 2012-08-01 MED ORDER — LORAZEPAM 1 MG PO TABS
1.0000 mg | ORAL_TABLET | Freq: Once | ORAL | Status: AC
  Administered 2012-08-01: 1 mg via ORAL
  Filled 2012-08-01: qty 1

## 2012-08-01 MED ORDER — VITAMIN B-1 100 MG PO TABS
100.0000 mg | ORAL_TABLET | Freq: Once | ORAL | Status: AC
  Administered 2012-08-01: 100 mg via ORAL
  Filled 2012-08-01: qty 1

## 2012-08-01 MED ORDER — IPRATROPIUM BROMIDE 0.02 % IN SOLN
0.5000 mg | Freq: Once | RESPIRATORY_TRACT | Status: AC
  Administered 2012-08-01: 0.5 mg via RESPIRATORY_TRACT
  Filled 2012-08-01: qty 2.5

## 2012-08-01 MED ORDER — ALBUTEROL SULFATE (5 MG/ML) 0.5% IN NEBU
5.0000 mg | INHALATION_SOLUTION | Freq: Once | RESPIRATORY_TRACT | Status: AC
  Administered 2012-08-01: 5 mg via RESPIRATORY_TRACT
  Filled 2012-08-01: qty 1

## 2012-08-01 NOTE — ED Notes (Signed)
carelink notified 

## 2012-08-01 NOTE — ED Provider Notes (Addendum)
History     CSN: [id number]  Arrival date & time 08/01/12  1552   First MD Initiated Contact with Patient 08/01/12 1554      Chief Complaint  Patient presents with  . Foot Swelling    (Consider location/radiation/quality/duration/timing/severity/associated sxs/prior treatment) Patient is a 51 y.o. female presenting with shortness of breath. The history is provided by the patient and the spouse.  Shortness of Breath  The current episode started more than 2 weeks ago (assoc with edema, worse with standing, is a smoker, ,hypertensive.). The onset was gradual. The problem has been gradually worsening. The problem is moderate. Associated symptoms include shortness of breath.    Past Medical History  Diagnosis Date  . Hypertension     History reviewed. No pertinent past surgical history.  History reviewed. No pertinent family history.  History  Substance Use Topics  . Smoking status: Current Everyday Smoker  . Smokeless tobacco: Not on file  . Alcohol Use: Yes    OB History    Grav Para Term Preterm Abortions TAB SAB Ect Mult Living                  Review of Systems  Constitutional: Negative.   HENT: Negative.   Respiratory: Positive for shortness of breath.   Cardiovascular: Positive for leg swelling.  Gastrointestinal: Negative.     Allergies  Benazepril hcl  Home Medications   Current Outpatient Rx  Name Route Sig Dispense Refill  . METOPROLOL SUCCINATE ER 100 MG PO TB24 Oral Take 100 mg by mouth daily. Take with or immediately following a meal.      BP 197/120  Pulse 96  Temp 98.9 F (37.2 C) (Oral)  Resp 20  SpO2 97%  Physical Exam  Nursing note and vitals reviewed. Constitutional: She is oriented to person, place, and time. She appears well-developed and well-nourished.  Eyes: Pupils are equal, round, and reactive to light.  Neck: Normal range of motion. Neck supple.  Cardiovascular: Normal rate, regular rhythm and intact distal pulses.  Exam  reveals gallop.   Pulmonary/Chest: She has decreased breath sounds.  Abdominal: Soft. Bowel sounds are normal.  Musculoskeletal: She exhibits edema.  Lymphadenopathy:    She has no cervical adenopathy.  Neurological: She is alert and oriented to person, place, and time.  Skin: Skin is warm and dry.    ED Course  Procedures (including critical care time)  Labs Reviewed  POCT I-STAT, CHEM 8 - Abnormal; Notable for the following:    BUN <3 (*)     Hemoglobin 15.3 (*)     All other components within normal limits   Dg Chest 2 View  08/01/2012  *RADIOLOGY REPORT*  Clinical Data: Shortness of breath, edema  CHEST - 2 VIEW  Comparison: None.  Findings: Enlarged cardiac silhouette and mediastinal contours. Mild cephalization of flow without frank evidence of edema. Minimal bibasilar opacities favored to represent atelectasis.  No pleural effusion or pneumothorax.  No acute osseous abnormalities.  IMPRESSION: Enlarged cardiac silhouette and mild pulmonary venous congestion without frank evidence of edema.   Original Report Authenticated By: Waynard Reeds, M.D.      1. CHF, acute   2. Hypertension complications   3. Edema of both legs       MDM  X-rays reviewed and report per radiologist.         Linna Hoff, MD 08/01/12 1713  Linna Hoff, MD 08/01/12 [phone]

## 2012-08-01 NOTE — ED Notes (Signed)
Sent from [facility] for new conset CHF. Denies CP.

## 2012-08-01 NOTE — ED Notes (Addendum)
BP informed to MD Kindl & RN Selena Batten

## 2012-08-01 NOTE — ED Provider Notes (Signed)
History     CSN: [id number]  Arrival date & time 08/01/12  1807   First MD Initiated Contact with Patient 08/01/12 1809      Chief Complaint  Patient presents with  . Shortness of Breath  . Leg Swelling  . Congestive Heart Failure    (Consider location/radiation/quality/duration/timing/severity/associated sxs/prior treatment) HPI Comments: Pt with hx of HTN, non compliance, daily alcohol use, 30 pack yr smoking hx and family hx of CAD in 53s comes in with cc of SOB. Pt reports that for the past 2 weeks she has noticed some leg swelling in her bilateral extremity, and over the past 3 days or so she has been having exertional SOB with no associated chest pain. She not gets SOB with walking about a block - while before that was not a problem. She has no new cough. Pt has bilateral lower extremity swelling - new. No hx of CHF, no known CAD. Pt reports having no orthopnea, but on couple of nights sha has had some PND. Pt noted to have slightly icteric sclerae - she denies nay hx of known liver disease.    Patient is a 51 y.o. female presenting with shortness of breath and CHF. The history is provided by the patient.  Shortness of Breath  Associated symptoms include shortness of breath. Pertinent negatives include no chest pain, no cough and no wheezing.  Congestive Heart Failure Associated symptoms include shortness of breath. Pertinent negatives include no chest pain and no abdominal pain.    Past Medical History  Diagnosis Date  . Hypertension     History reviewed. No pertinent past surgical history.  No family history on file.  History  Substance Use Topics  . Smoking status: Current Everyday Smoker  . Smokeless tobacco: Not on file  . Alcohol Use: Yes    OB History    Grav Para Term Preterm Abortions TAB SAB Ect Mult Living                  Review of Systems  Constitutional: Negative for activity change.  HENT: Negative for facial swelling and neck pain.     Respiratory: Positive for shortness of breath. Negative for cough and wheezing.   Cardiovascular: Positive for leg swelling. Negative for chest pain.  Gastrointestinal: Negative for nausea, vomiting, abdominal pain, diarrhea, constipation, blood in stool and abdominal distention.  Genitourinary: Negative for hematuria and difficulty urinating.  Skin: Negative for color change.  Neurological: Negative for speech difficulty.  Hematological: Does not bruise/bleed easily.  Psychiatric/Behavioral: Negative for confusion.    Allergies  Benazepril hcl  Home Medications   Current Outpatient Rx  Name Route Sig Dispense Refill  . METOPROLOL SUCCINATE ER 100 MG PO TB24 Oral Take 100 mg by mouth daily. Take with or immediately following a meal.      BP 173/109  Pulse 79  Temp 98 F (36.7 C) (Oral)  SpO2 100%  Physical Exam  Nursing note and vitals reviewed. Constitutional: She is oriented to person, place, and time. She appears well-developed and well-nourished.  HENT:  Head: Normocephalic and atraumatic.  Eyes: EOM are normal. Pupils are equal, round, and reactive to light.  Neck: Neck supple. No JVD present.  Cardiovascular: Normal rate, regular rhythm and normal heart sounds.   No murmur heard. Pulmonary/Chest: Effort normal. No respiratory distress.  Abdominal: Soft. She exhibits no distension. There is no tenderness. There is no rebound and no guarding.  Musculoskeletal: She exhibits edema.  Neurological: She is alert  and oriented to person, place, and time.  Skin: Skin is warm and dry.    ED Course  Procedures (including critical care time)   Labs Reviewed  APTT  BASIC METABOLIC PANEL  CBC WITH DIFFERENTIAL  TROPONIN I  PROTIME-INR  PRO B NATRIURETIC PEPTIDE  URINALYSIS, ROUTINE W REFLEX MICROSCOPIC  HEPATIC FUNCTION PANEL   Dg Chest 2 View  08/01/2012  *RADIOLOGY REPORT*  Clinical Data: Shortness of breath, edema  CHEST - 2 VIEW  Comparison: None.  Findings:  Enlarged cardiac silhouette and mediastinal contours. Mild cephalization of flow without frank evidence of edema. Minimal bibasilar opacities favored to represent atelectasis.  No pleural effusion or pneumothorax.  No acute osseous abnormalities.  IMPRESSION: Enlarged cardiac silhouette and mild pulmonary venous congestion without frank evidence of edema.   Original Report Authenticated By: Waynard Reeds, M.D.      No diagnosis found.    MDM   Date: 08/01/2012  Rate:77  Rhythm: normal sinus rhythm  QRS Axis: normal  Intervals: normal  ST/T Wave abnormalities: normal  Conduction Disutrbances:none  Narrative Interpretation:   Old EKG Reviewed: none available  Differential diagnosis includes: ACS syndrome CHF exacerbation Valvular disorder Pericardial effusion Pleural effusion Pulmonary edema PE Anemia Liver dysfunction - low protein state Nephrotic syndrome  Pt comes in with cc of exertional SOB, pitting edema. Pt has hx of alcohol use - daily, and has some icteric sclerae. It is possible that she is 3rd spacing from the low albumin state 2/2 liver dysfunction - will get LFTs. Also - pt has alcohol abuse with smoking hx and uncontrolled HTN and family hx of CAD - so she might bne having cardiomyopathy. Will get CXR, BNP, troponin.  Pt has no pcp. Will require admission for echo and optimization.          Derwood Kaplan, MD 08/01/12 [phone]

## 2012-08-01 NOTE — ED Notes (Addendum)
C/o SOB with exertion x 3 days & BLE edema x 2 weeks. Denies cold, cough, CP. +orthopnea, states wakes up in middle of night gasping for air. Pt admits to not taking BP meds consistently & drinks 2 forties beer days that she works & 4 forties on days off.

## 2012-08-01 NOTE — ED Notes (Signed)
Bilateral foot swelling and sob with activity.  [facility].  Marland Kitchen

## 2012-08-02 ENCOUNTER — Encounter (HOSPITAL_COMMUNITY): Payer: Self-pay | Admitting: Internal Medicine

## 2012-08-02 DIAGNOSIS — I509 Heart failure, unspecified: Secondary | ICD-10-CM

## 2012-08-02 DIAGNOSIS — I10 Essential (primary) hypertension: Secondary | ICD-10-CM

## 2012-08-02 DIAGNOSIS — F3160 Bipolar disorder, current episode mixed, unspecified: Secondary | ICD-10-CM | POA: Diagnosis present

## 2012-08-02 DIAGNOSIS — F1020 Alcohol dependence, uncomplicated: Secondary | ICD-10-CM

## 2012-08-02 DIAGNOSIS — E669 Obesity, unspecified: Secondary | ICD-10-CM | POA: Diagnosis present

## 2012-08-02 DIAGNOSIS — F1010 Alcohol abuse, uncomplicated: Secondary | ICD-10-CM | POA: Diagnosis present

## 2012-08-02 DIAGNOSIS — I160 Hypertensive urgency: Secondary | ICD-10-CM | POA: Diagnosis present

## 2012-08-02 DIAGNOSIS — I161 Hypertensive emergency: Secondary | ICD-10-CM

## 2012-08-02 DIAGNOSIS — F319 Bipolar disorder, unspecified: Secondary | ICD-10-CM

## 2012-08-02 DIAGNOSIS — F17200 Nicotine dependence, unspecified, uncomplicated: Secondary | ICD-10-CM

## 2012-08-02 DIAGNOSIS — M7989 Other specified soft tissue disorders: Secondary | ICD-10-CM

## 2012-08-02 LAB — CREATININE, SERUM: GFR calc Af Amer: >90 mL/min (ref >90)

## 2012-08-02 LAB — LIPID PANEL
Cholesterol: 178 mg/dL (ref 0–200)
HDL: 72 mg/dL (ref >39)
Total CHOL/HDL Ratio: 2.5 RATIO
Triglycerides: 102 mg/dL (ref <150)

## 2012-08-02 LAB — CBC
HCT: 39.9 % (ref 36.0–46.0)
MCV: 83.8 fL (ref 78.0–100.0)
RBC: 4.76 MIL/uL (ref 3.87–5.11)
WBC: 5.4 10*3/uL (ref 4.0–10.5)

## 2012-08-02 LAB — MAGNESIUM: Magnesium: 2.1 mg/dL (ref 1.5–2.5)

## 2012-08-02 LAB — BASIC METABOLIC PANEL
BUN: 5 mg/dL — ABNORMAL LOW (ref 6–23)
Chloride: 101 mEq/L (ref 96–112)
Creatinine, Ser: 0.81 mg/dL (ref 0.50–1.10)
GFR calc Af Amer: >90 mL/min (ref >90)
GFR calc non Af Amer: 83 mL/min — ABNORMAL LOW (ref >90)
Potassium: 3.7 mEq/L (ref 3.5–5.1)

## 2012-08-02 MED ORDER — CARVEDILOL 3.125 MG PO TABS
3.1250 mg | ORAL_TABLET | Freq: Two times a day (BID) | ORAL | Status: DC
  Administered 2012-08-02 – 2012-08-03 (×2): 3.125 mg via ORAL
  Filled 2012-08-02 (×5): qty 1

## 2012-08-02 MED ORDER — SODIUM CHLORIDE 0.9 % IV SOLN: 250.0000 mL | INTRAVENOUS | Status: DC | PRN

## 2012-08-02 MED ORDER — ENOXAPARIN SODIUM 40 MG/0.4ML ~~LOC~~ SOLN
40.0000 mg | SUBCUTANEOUS | Status: DC
  Administered 2012-08-02: 40 mg via SUBCUTANEOUS
  Filled 2012-08-02 (×2): qty 0.4

## 2012-08-02 MED ORDER — FUROSEMIDE 40 MG PO TABS
40.0000 mg | ORAL_TABLET | Freq: Every day | ORAL | Status: DC
  Administered 2012-08-03: 40 mg via ORAL
  Filled 2012-08-02 (×2): qty 1

## 2012-08-02 MED ORDER — FUROSEMIDE 10 MG/ML IJ SOLN
40.0000 mg | Freq: Two times a day (BID) | INTRAMUSCULAR | Status: DC
  Administered 2012-08-02: 40 mg via INTRAVENOUS
  Filled 2012-08-02 (×3): qty 4

## 2012-08-02 MED ORDER — LORAZEPAM 0.5 MG PO TABS: 1.0000 mg | ORAL_TABLET | Freq: Four times a day (QID) | ORAL | Status: DC | PRN

## 2012-08-02 MED ORDER — PNEUMOCOCCAL VAC POLYVALENT 25 MCG/0.5ML IJ INJ
0.5000 mL | INJECTION | INTRAMUSCULAR | Status: AC
  Administered 2012-08-03: 0.5 mL via INTRAMUSCULAR
  Filled 2012-08-02 (×2): qty 0.5

## 2012-08-02 MED ORDER — HYDRALAZINE HCL 20 MG/ML IJ SOLN
10.0000 mg | INTRAMUSCULAR | Status: DC | PRN
  Filled 2012-08-02: qty 0.5

## 2012-08-02 MED ORDER — ACETAMINOPHEN 325 MG PO TABS: 650.0000 mg | ORAL_TABLET | ORAL | Status: DC | PRN

## 2012-08-02 MED ORDER — ISOSORBIDE MONONITRATE ER 30 MG PO TB24
30.0000 mg | ORAL_TABLET | Freq: Every day | ORAL | Status: DC
  Administered 2012-08-02: 30 mg via ORAL
  Filled 2012-08-02: qty 1

## 2012-08-02 MED ORDER — SODIUM CHLORIDE 0.9 % IJ SOLN
3.0000 mL | Freq: Two times a day (BID) | INTRAMUSCULAR | Status: DC
  Administered 2012-08-02 – 2012-08-03 (×4): 3 mL via INTRAVENOUS

## 2012-08-02 MED ORDER — CARVEDILOL 12.5 MG PO TABS
12.5000 mg | ORAL_TABLET | Freq: Two times a day (BID) | ORAL | Status: DC
  Administered 2012-08-02: 12.5 mg via ORAL
  Filled 2012-08-02 (×3): qty 1

## 2012-08-02 MED ORDER — POTASSIUM CHLORIDE CRYS ER 20 MEQ PO TBCR
40.0000 meq | EXTENDED_RELEASE_TABLET | Freq: Every day | ORAL | Status: DC
  Administered 2012-08-02 – 2012-08-03 (×2): 40 meq via ORAL
  Filled 2012-08-02 (×2): qty 2

## 2012-08-02 MED ORDER — NICOTINE 21 MG/24HR TD PT24
21.0000 mg | MEDICATED_PATCH | Freq: Every day | TRANSDERMAL | Status: DC
  Administered 2012-08-02 – 2012-08-03 (×2): 21 mg via TRANSDERMAL
  Filled 2012-08-02 (×2): qty 1

## 2012-08-02 MED ORDER — LORAZEPAM 2 MG/ML IJ SOLN: 1.0000 mg | Freq: Four times a day (QID) | INTRAMUSCULAR | Status: DC | PRN

## 2012-08-02 MED ORDER — HYDRALAZINE HCL 25 MG PO TABS
25.0000 mg | ORAL_TABLET | Freq: Three times a day (TID) | ORAL | Status: DC
  Administered 2012-08-02 (×2): 25 mg via ORAL
  Filled 2012-08-02 (×6): qty 1

## 2012-08-02 MED ORDER — VITAMIN B-1 100 MG PO TABS
100.0000 mg | ORAL_TABLET | Freq: Every day | ORAL | Status: DC
  Administered 2012-08-02 – 2012-08-03 (×2): 100 mg via ORAL
  Filled 2012-08-02 (×2): qty 1

## 2012-08-02 MED ORDER — FUROSEMIDE 40 MG PO TABS
40.0000 mg | ORAL_TABLET | Freq: Two times a day (BID) | ORAL | Status: DC
  Administered 2012-08-02: 40 mg via ORAL
  Filled 2012-08-02 (×2): qty 1

## 2012-08-02 MED ORDER — METOPROLOL TARTRATE 25 MG PO TABS
25.0000 mg | ORAL_TABLET | ORAL | Status: DC | PRN
  Filled 2012-08-02: qty 1

## 2012-08-02 MED ORDER — ONDANSETRON HCL 4 MG/2ML IJ SOLN: 4.0000 mg | INTRAMUSCULAR | Status: DC | PRN

## 2012-08-02 MED ORDER — ASPIRIN EC 81 MG PO TBEC
81.0000 mg | DELAYED_RELEASE_TABLET | Freq: Every day | ORAL | Status: DC
  Administered 2012-08-02 – 2012-08-03 (×2): 81 mg via ORAL
  Filled 2012-08-02 (×2): qty 1

## 2012-08-02 MED ORDER — ADULT MULTIVITAMIN W/MINERALS CH
1.0000 | ORAL_TABLET | Freq: Every day | ORAL | Status: DC
  Administered 2012-08-02 – 2012-08-03 (×2): 1 via ORAL
  Filled 2012-08-02 (×2): qty 1

## 2012-08-02 MED ORDER — POTASSIUM CHLORIDE CRYS ER 20 MEQ PO TBCR
40.0000 meq | EXTENDED_RELEASE_TABLET | Freq: Two times a day (BID) | ORAL | Status: DC
  Filled 2012-08-02 (×2): qty 2

## 2012-08-02 MED ORDER — SODIUM CHLORIDE 0.9 % IJ SOLN: 3.0000 mL | INTRAMUSCULAR | Status: DC | PRN
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
## 2012-08-02 NOTE — Progress Notes (Signed)
[facility] PROGRESS NOTE  Patricia Mejia NFA:[id number] DOB: [birth year] DOA: 08/01/2012 PCP: Lehman Prom, NP  Assessment/Plan: Acute CHF -Echocardiogram is pending, adjust medications pending results -Continue carvedilol -Repeat BMP today -Continue furosemide, switched to by mouth Major Depression -Patient had suicidal and homicidal ideation yesterday -These have resolved today, she is feeling better -Consulted behavioral health today Hypertension -Continue carvedilol, hydralazine, Imdur Lower extremity edema/pain -Lower extremity Dopplers Alcohol abuse -Start thiamine, alcohol withdrawal protocol   Procedures/Studies: Dg Chest 2 View  08/01/2012  *RADIOLOGY REPORT*  Clinical Data: Shortness of breath, edema  CHEST - 2 VIEW  Comparison: None.  Findings: Enlarged cardiac silhouette and mediastinal contours. Mild cephalization of flow without frank evidence of edema. Minimal bibasilar opacities favored to represent atelectasis.  No pleural effusion or pneumothorax.  No acute osseous abnormalities.  IMPRESSION: Enlarged cardiac silhouette and mild pulmonary venous congestion without frank evidence of edema.   Original Report Authenticated By: Waynard Reeds, M.D.    Dg Chest Port 1 View  08/01/2012  *RADIOLOGY REPORT*  Clinical Data: Shortness of breath.  PORTABLE CHEST - 1 VIEW  Comparison: 08/01/2012.  Findings: Cardiomegaly.  Pulmonary vascular congestion.  No gross pneumothorax or segmental consolidation.  IMPRESSION: Cardiomegaly.  Pulmonary vascular congestion. This is accentuated by portable technique.   Original Report Authenticated By: Fuller Canada, M.D.       Code Status: Full Family Communication: Significant other at bedside Disposition Plan: Home when medically stable  Subjective: Patient is feeling better today. She states her shortness of breath some 5% better. Denies any chest pain, nausea, vomiting, diarrhea, fevers, chills. She has no homicidal or  suicidal ideation today.  Objective: Filed Vitals:   08/01/12 2312 08/01/12 2325 08/02/12 0020 08/02/12 0530  BP: 161/101 161/101 155/96 123/83  Pulse: 82 80 77 79  Temp: 98.1 F (36.7 C)  98.5 F (36.9 C) 98.6 F (37 C)  TempSrc: Oral  Oral Oral  Resp: 22 24 22 20   Height:   5\' 4"  (1.626 m)   Weight:   103.103 kg (227 lb 4.8 oz) 103.1 kg (227 lb 4.7 oz)  SpO2: 100% 100% 100% 99%    Intake/Output Summary (Last 24 hours) at 08/02/12 0905 Last data filed at 08/02/12 0858  Gross per 24 hour  Intake    120 ml  Output      0 ml  Net    120 ml   Weight change:  Exam:   General:  Pt is alert, follows commands appropriately, not in acute distress  HEENT: No icterus, No thrush, No neck mass, [place]/AT  Cardiovascular: Regular rate and rhythm, S1/S2, no rubs, no gallops  Respiratory: Clear to auscultation bilaterally, no wheezing, no crackles, no rhonchi  Abdomen: Soft, non tender, non distended, bowel sounds present, no guarding  Extremities: 1+ edema bilateral lower extremities with tenderness to palpation of calves, No lymphangitis, No petechiae, No rashes, no synovitis  Data Reviewed: Basic Metabolic Panel:  Lab 08/02/12 4696 08/01/12 1851 08/01/12 1706  NA -- 140 138  K -- 4.8 4.8  CL -- 105 107  CO2 -- 23 --  GLUCOSE -- 117* 94  BUN -- 4* <3*  CREATININE 0.74 0.67 0.80  CALCIUM -- 9.7 --  MG 2.1 -- --  PHOS -- -- --   Liver Function Tests:  Lab 08/01/12 1851  AST 43*  ALT 27  ALKPHOS 106  BILITOT 0.3  PROT 8.1  ALBUMIN 3.7   No results found for this basename: LIPASE:5,AMYLASE:5  in the last 168 hours No results found for this basename: AMMONIA:5 in the last 168 hours CBC:  Lab 08/02/12 0630 08/01/12 1851 08/01/12 1706  WBC 5.4 5.1 --  NEUTROABS -- 2.8 --  HGB 12.6 13.7 15.3*  HCT 39.9 43.1 45.0  MCV 83.8 83.2 --  PLT 164 182 --   Cardiac Enzymes:  Lab 08/01/12 1851  CKTOTAL --  CKMB --  CKMBINDEX --  TROPONINI <0.30   BNP: No components  found with this basename: POCBNP:5 CBG: No results found for this basename: GLUCAP:5 in the last 168 hours  No results found for this or any previous visit (from the past 240 hour(s)).   Scheduled Meds:   . albuterol  5 mg Nebulization Once  . aspirin  162 mg Oral Once  . aspirin EC  81 mg Oral Daily  . carvedilol  12.5 mg Oral BID WC  . enoxaparin  40 mg Subcutaneous Q24H  . furosemide  40 mg Intravenous Q12H  . hydrALAZINE  25 mg Oral Q8H  . ipratropium  0.5 mg Nebulization Once  . isosorbide mononitrate  30 mg Oral Daily  . LORazepam  1 mg Oral Once  . nicotine  21 mg Transdermal Daily  . pneumococcal 23 valent vaccine  0.5 mL Intramuscular Tomorrow-1000  . potassium chloride  40 mEq Oral BID  . sodium chloride  3 mL Intravenous Q12H  . thiamine  100 mg Oral Once   Total time spent- , greater than 50% spent coordinating care  Shea Kapur, DO  [facility] Pager [phone]  If 7PM-7AM, please contact night-coverage [email or web address] Password [email or web address] 08/02/2012, 9:05 AM   LOS: 1 day

## 2012-08-02 NOTE — Progress Notes (Signed)
VASCULAR LAB PRELIMINARY  PRELIMINARY  PRELIMINARY  PRELIMINARY  Bilateral lower extremity venous duplex completed.    Preliminary report: No obvious deep or superficial vein thrombosis noted in the lower extremities bilaterally.  Negative for Baker's cyst.  Shonika Kolasinski, 08/02/2012, 2:45 PM
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
## 2012-08-02 NOTE — Progress Notes (Signed)
*  PRELIMINARY RESULTS* Echocardiogram 2D Echocardiogram has been performed.  Sable Knoles 08/02/2012, 12:10 PM 

## 2012-08-03 ENCOUNTER — Encounter (HOSPITAL_COMMUNITY): Payer: Self-pay

## 2012-08-03 DIAGNOSIS — I5031 Acute diastolic (congestive) heart failure: Secondary | ICD-10-CM

## 2012-08-03 DIAGNOSIS — F329 Major depressive disorder, single episode, unspecified: Secondary | ICD-10-CM

## 2012-08-03 LAB — BASIC METABOLIC PANEL
CO2: 27 mEq/L (ref 19–32)
Calcium: 9.4 mg/dL (ref 8.4–10.5)
Chloride: 98 mEq/L (ref 96–112)
Creatinine, Ser: 1.05 mg/dL (ref 0.50–1.10)
GFR calc Af Amer: 70 mL/min — ABNORMAL LOW (ref >90)
Sodium: 134 mEq/L — ABNORMAL LOW (ref 135–145)

## 2012-08-03 MED ORDER — UNABLE TO FIND: Status: DC

## 2012-08-03 MED ORDER — ASPIRIN 81 MG PO TBEC: 81.0000 mg | DELAYED_RELEASE_TABLET | Freq: Every day | ORAL | Status: AC

## 2012-08-03 MED ORDER — FUROSEMIDE 20 MG PO TABS: 20.0000 mg | ORAL_TABLET | Freq: Every day | ORAL | Status: DC

## 2012-08-03 MED ORDER — CARVEDILOL 3.125 MG PO TABS: 3.1250 mg | ORAL_TABLET | Freq: Two times a day (BID) | ORAL | Status: DC

## 2012-08-03 NOTE — Discharge Summary (Signed)
Physician Discharge Summary  Patricia Mejia AVW:[id number] DOB: 09/09/1961 DOA: 08/01/2012  PCP: Lehman Prom, NP  Admit date: 08/01/2012 Discharge date: 08/03/2012  Recommendations for Outpatient Follow-up:  1. Pt will need to follow up at [facility] in 1 week for Basic Metabolic panel 2. Please obtain BMP to evaluate electrolytes and kidney function 3. Please also check CBC to evaluate Hg and Hct levels  Discharge Diagnoses:  Acute diastolic CHF  -Clinically improved with diuresis -Echocardiogram revealed ejection fraction 60-65% -Continue carvedilol 3.125 mg twice a day -Continue furosemide 20 mg daily Major Depression  -Patient no longer has suicidal or homicidal ideations -Appreciate psychiatry evaluation Hypertension  -Continue carvedilol -Hydralazine and Imdur were discontinued due to the patient's hypotension. Lower extremity edema/pain  -Lower extremity Dopplers negative Alcohol abuse  -I counseled the patient on the adverse effects and cessation Tobacco abuse  tobacco cessation discussed  Discharge Condition: Stable  Disposition:  discharge home  Diet: Cardiac prudent Wt Readings from Last 3 Encounters:  08/03/12 101.515 kg (223 lb 12.8 oz)  05/13/10 98.884 kg (218 lb)  02/13/10 99.292 kg (218 lb 14.4 oz)    History of present illness:  51 year old female noncompliant with antihypertensive therapy presents with two-week history of progressive lower extremity edema and one year of paroxysmal nocturnal dyspnea. The patient also had homicidal and suicidal ideations toward her boyfriend. She admitted to drinking 3-4x40 ounce cans of beer 2 times per week and one 40 ounce of beer every night. She has been doing this for approximately 20+ years.  Hospital Course:  The patient was admitted to the hospital for acute CHF. The patient was treated with intravenous furosemide. The furosemide was converted to oral therapy. The patient clinically improved with good  diuresis. The patient was started on carvedilol, hydralazine, and Imdur. Unfortunately, the patient became hypotensive. Her carvedilol dose was decreased to 3.125 mg twice a day and hydralazine and Imdur were discontinued. With these changes, the patient remained hemodynamically stable and afebrile. Her hemoglobin A1c was 5.3. The patient's lower extremity edema was evaluated with lower extremity Dopplers. It was negative for DVT. The patient was started on alcohol withdrawal protocol. She did not require any Ativan. The patient was on thiamine daily. Prior to discharge, the patient was given information to the [facility] for followup. In addition she was given a one-month supply of carvedilol and furosemide. Tobacco and alcohol cessation was provided. Psychiatry saw the patient in consultation. The patient did not have any further suicidal or homicidal ideations. Psychiatry deemed her stable for discharge. Crisis information was provided to the patient.  Consultants: none  Discharge Exam: Filed Vitals:   08/03/12 0928  BP: 107/65  Pulse: 75  Temp:   Resp:    Filed Vitals:   08/02/12 1509 08/02/12 2109 08/03/12 0450 08/03/12 0928  BP: 111/77 113/63 106/66 107/65  Pulse: 88 99 88 75  Temp: 98.7 F (37.1 C) 98.5 F (36.9 C) 98.2 F (36.8 C)   TempSrc: Oral Oral Oral   Resp: 20 20 20    Height:      Weight:   101.515 kg (223 lb 12.8 oz)   SpO2: 100% 98% 100%    General: A&O x 3, NAD, pleasant, cooperative Cardiovascular: RRR, no rub, no gallop, no S3 Respiratory: CTAB, no wheeze, no rhonchi Abdomen:soft, nontender, nondistended, positive bowel sounds  Discharge Instructions  Discharge Orders    Future Orders Please Complete By Expires   Diet - low sodium heart healthy  Increase activity slowly      Discharge instructions      Comments:   Follow up at [facility] in 1 week for basic metabolic panel     Medication List  As of 08/03/2012  1:06 PM   STOP taking  these medications         amLODipine 10 MG tablet      metoprolol succinate 100 MG 24 hr tablet         TAKE these medications         aspirin 81 MG EC tablet   Take 1 tablet (81 mg total) by mouth daily.      carvedilol 3.125 MG tablet   Commonly known as: COREG   Take 1 tablet (3.125 mg total) by mouth 2 (two) times daily with a meal.      furosemide 20 MG tablet   Commonly known as: LASIX   Take 1 tablet (20 mg total) by mouth daily.             The results of significant diagnostics from this hospitalization (including imaging, microbiology, ancillary and laboratory) are listed below for reference.    Significant Diagnostic Studies: Dg Chest 2 View  08/01/2012  *RADIOLOGY REPORT*  Clinical Data: Shortness of breath, edema  CHEST - 2 VIEW  Comparison: None.  Findings: Enlarged cardiac silhouette and mediastinal contours. Mild cephalization of flow without frank evidence of edema. Minimal bibasilar opacities favored to represent atelectasis.  No pleural effusion or pneumothorax.  No acute osseous abnormalities.  IMPRESSION: Enlarged cardiac silhouette and mild pulmonary venous congestion without frank evidence of edema.   Original Report Authenticated By: Waynard Reeds, M.D.    Dg Chest Port 1 View  08/01/2012  *RADIOLOGY REPORT*  Clinical Data: Shortness of breath.  PORTABLE CHEST - 1 VIEW  Comparison: 08/01/2012.  Findings: Cardiomegaly.  Pulmonary vascular congestion.  No gross pneumothorax or segmental consolidation.  IMPRESSION: Cardiomegaly.  Pulmonary vascular congestion. This is accentuated by portable technique.   Original Report Authenticated By: Fuller Canada, M.D.      Microbiology: No results found for this or any previous visit (from the past 240 hour(s)).   Labs: Basic Metabolic Panel:  Lab 08/03/12 1610 08/02/12 0630 08/02/12 0605 08/01/12 1851 08/01/12 1706  NA 134* -- 137 140 138  K 4.3 -- 3.7 -- --  CL 98 -- 101 105 107  CO2 27 -- 24 23 --    GLUCOSE 106* -- 101* 117* 94  BUN 10 -- 5* 4* <3*  CREATININE 1.05 0.74 0.81 0.67 0.80  CALCIUM 9.4 -- 9.5 9.7 --  MG -- 2.1 -- -- --  PHOS -- -- -- -- --   Liver Function Tests:  Lab 08/01/12 1851  AST 43*  ALT 27  ALKPHOS 106  BILITOT 0.3  PROT 8.1  ALBUMIN 3.7   No results found for this basename: LIPASE:5,AMYLASE:5 in the last 168 hours No results found for this basename: AMMONIA:5 in the last 168 hours CBC:  Lab 08/02/12 0630 08/01/12 1851 08/01/12 1706  WBC 5.4 5.1 --  NEUTROABS -- 2.8 --  HGB 12.6 13.7 15.3*  HCT 39.9 43.1 45.0  MCV 83.8 83.2 --  PLT 164 182 --   Cardiac Enzymes:  Lab 08/01/12 1851  CKTOTAL --  CKMB --  CKMBINDEX --  TROPONINI <0.30   BNP: No components found with this basename: POCBNP:5 CBG: No results found for this basename: GLUCAP:5 in the last 168 hours  Time coordinating discharge:  Greater than 30 minutes  Signed:  Julias Mould, DO [facility] Pager: [phone] 08/03/2012, 1:06 PM

## 2012-08-03 NOTE — Care Management Note (Signed)
    Page 1 of 1   08/03/2012     2:22:17 PM   CARE MANAGEMENT NOTE 08/03/2012  Patient:  Patricia Mejia, Patricia Mejia   Account Number:  [id number]  Date Initiated:  08/03/2012  Documentation initiated by:  Tera Mater  Subjective/Objective Assessment:   51yo female admitted with CHF.  Pt. lives with children.     Action/Plan:   Discharge planning for [facility] services and PCP   Anticipated DC Date:  08/03/2012   Anticipated DC Plan:  HOME W HOME HEALTH SERVICES      DC Planning Services  CM consult      Choice offered to / List presented to:          [facility] arranged  HH-1 RN  HH-10 DISEASE MANAGEMENT      HH agency  [facility]   Status of service:  Completed, signed off Medicare Important Message given?   (If response is "NO", the following Medicare IM given date fields will be blank) Date Medicare IM given:   Date Additional Medicare IM given:    Discharge Disposition:  HOME W HOME HEALTH SERVICES  Per UR Regulation:  Reviewed for med. necessity/level of care/duration of stay  If discussed at Long Length of Stay Meetings, dates discussed:    Comments:  08/03/12 1130 Tera Mater, RN,BSN NCM [phone] In to speak to pt. about [facility] services and HF education.  Pt. is interested in having a HH RN for HF managment.  Pt. was seen at [facility] for her PCP.  TC to Hilda Lias, with [facility] to give referral for [employer] RN for HF management.  TC to [facility] [phone]) to make appointment to see physician. Appointment set for September 14th, at 12:15pm.  Gave this information to pt.

## 2012-08-03 NOTE — Progress Notes (Signed)
Clinical Social Work Progress Note PSYCHIATRY SERVICE LINE 08/03/2012  Patient:  Patricia Mejia  Account:  [id number]  Admit Date:  08/01/2012  Clinical Social Worker:  Ashley Jacobs, LCSW  Date/Time:  08/03/2012 10:41 AM  Review of Patient  Overall Medical Condition:   patient sitting up on side of the bed with Psych MD following up with consult. Invited into room and to discuss disposition and resources.  Patient not interested in inpatient psych admission due to not feeling suicidal, having full time employment and wanting to go home.  Patient agreeable to safety plan and review outpatient and inpatient options.  Information given to patient about support groups, individual counseling, couples counseling and SA counseling.  Patient reports motivation and wanting to stop and also reports her BF is also willing to stop and they both go to AA for additional support.  Patient is tearful and reports embarrassment over problem, however she is given emotional support and techniques from strengths based therapy are utilized with patient.  Patient is agreeable to follow up outpatient and educated about crisis mobile information.  Patient to dc home today per her report and hopes. When medically stable she is cleared from Psych and given all tools for successful outpatient follow up in which she is agreeable.  No Si, HI or psychosis.  She is alert and orientedx4   Participation Level:  Active  Participation Quality  Appropriate   Other Participation Quality:   patient very engaged, asking questions, and wanting follow up.  Reports she will do her best and follow up.   Affect  Flat   Cognitive  Appropriate   Reaction to Medications/Concerns:   Patient agreeable to start medications for mood.   Modes of Intervention  Education  Support  Behaviors/Psychosis   Summary of Progress/Plan at Discharge   1. When medically stable to dc home.  2. Outpatient information given to patient.  3.  Crisis information given and patient safety contracted with CSW. All copies are in patient hard chart.  4. No other needs or interventions identified.    Psych CSW signs off.   Ashley Jacobs, MSW LCSW [phone]
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
## 2013-02-22 ENCOUNTER — Emergency Department (INDEPENDENT_AMBULATORY_CARE_PROVIDER_SITE_OTHER)
Admission: EM | Admit: 2013-02-22 | Discharge: 2013-02-22 | Disposition: A | Discharge status: Home / Self Care | Payer: Self-pay | Source: Home / Self Care | Attending: Family Medicine | Admitting: Family Medicine

## 2013-02-22 ENCOUNTER — Encounter (HOSPITAL_COMMUNITY): Payer: Self-pay

## 2013-02-22 DIAGNOSIS — I10 Essential (primary) hypertension: Secondary | ICD-10-CM

## 2013-02-22 DIAGNOSIS — Z23 Encounter for immunization: Secondary | ICD-10-CM

## 2013-02-22 DIAGNOSIS — I509 Heart failure, unspecified: Secondary | ICD-10-CM

## 2013-02-22 LAB — COMPREHENSIVE METABOLIC PANEL
ALT: 30 U/L (ref 0–35)
AST: 38 U/L — ABNORMAL HIGH (ref 0–37)
Albumin: 3.7 g/dL (ref 3.5–5.2)
Calcium: 9.5 mg/dL (ref 8.4–10.5)
Creatinine, Ser: 0.65 mg/dL (ref 0.50–1.10)
Sodium: 138 mEq/L (ref 135–145)

## 2013-02-22 LAB — LIPID PANEL
HDL: 75 mg/dL (ref >39)
LDL Cholesterol: 83 mg/dL (ref 0–99)
Total CHOL/HDL Ratio: 2.5 RATIO
Triglycerides: 151 mg/dL — ABNORMAL HIGH (ref <150)
VLDL: 30 mg/dL (ref 0–40)

## 2013-02-22 LAB — CBC
MCH: 27.3 pg (ref 26.0–34.0)
MCV: 82.8 fL (ref 78.0–100.0)
Platelets: 160 10*3/uL (ref 150–400)
RDW: 12.9 % (ref 11.5–15.5)
WBC: 5.6 10*3/uL (ref 4.0–10.5)

## 2013-02-22 MED ORDER — INFLUENZA VIRUS VACC SPLIT PF IM SUSP
0.5000 mL | Freq: Once | INTRAMUSCULAR | Status: AC
  Administered 2013-02-22: 0.5 mL via INTRAMUSCULAR

## 2013-02-22 MED ORDER — LOSARTAN POTASSIUM 25 MG PO TABS: 25.0000 mg | ORAL_TABLET | Freq: Every day | ORAL | Status: DC

## 2013-02-22 MED ORDER — CARVEDILOL 3.125 MG PO TABS: 3.1250 mg | ORAL_TABLET | Freq: Two times a day (BID) | ORAL | Status: DC

## 2013-02-22 MED ORDER — FUROSEMIDE 20 MG PO TABS: 20.0000 mg | ORAL_TABLET | Freq: Every day | ORAL | Status: DC

## 2013-02-22 NOTE — ED Provider Notes (Addendum)
History    CSN: [id number]  Arrival date & time 02/22/13  1729   First MD Initiated Contact with Patient 02/22/13 1750     Chief Complaint  Patient presents with  . Need Medications Refilled   HPI Pt says that she has been having palpatations since being off her coreg.  She has been out of medication for about a month.  She says that she was hospitalized back in September 2013.   She has had palpatations since being out of her coreg. She is out of lasix.  She denies SOB and swelling in extremities.  She denies CP.  She reports occasional headaches.  Past Medical History  Diagnosis Date  . Hypertension     Past Surgical History  Procedure Laterality Date  . Appendectomy      Family History  Problem Relation Age of Onset  . Alcohol abuse Mother   . Cirrhosis Mother   . Hypertension Mother   . Hypertension Father     History  Substance Use Topics  . Smoking status: Current Every Day Smoker -- 1.00 packs/day for 29 years    Types: Cigarettes  . Smokeless tobacco: Never Used  . Alcohol Use: Yes    OB History   Grav Para Term Preterm Abortions TAB SAB Ect Mult Living                 Review of Systems  Constitutional: Positive for fatigue.  Cardiovascular: Positive for palpitations.  Neurological: Positive for headaches.  All other systems reviewed and are negative.   Allergies  Benazepril hcl  Home Medications   Current Outpatient Rx  Name  Route  Sig  Dispense  Refill  . aspirin EC 81 MG EC tablet   Oral   Take 1 tablet (81 mg total) by mouth daily.   30 tablet   3   . carvedilol (COREG) 3.125 MG tablet   Oral   Take 1 tablet (3.125 mg total) by mouth 2 (two) times daily with a meal.   60 tablet   3   . furosemide (LASIX) 20 MG tablet   Oral   Take 1 tablet (20 mg total) by mouth daily.   30 tablet   3   . UNABLE TO FIND      Ms. Allanah Mcfarland was admitted and treated at [facility] from 08/01/12 to 08/03/12.  She will be medically stable  to return to work on 08/08/12.   1 Act   0    BP 160/99  Pulse 102  Temp(Src) 97.2 F (36.2 C) (Oral)  SpO2 100%  Physical Exam  Nursing note and vitals reviewed. Constitutional: She is oriented to person, place, and time. She appears well-developed and well-nourished. No distress.  HENT:  Head: Normocephalic and atraumatic.  Eyes: Conjunctivae and EOM are normal. Pupils are equal, round, and reactive to light. Left eye exhibits no discharge.  Neck: Normal range of motion. Neck supple. No JVD present. No tracheal deviation present. No thyromegaly present.  Cardiovascular: Regular rhythm and normal heart sounds.   Tachycardic   Pulmonary/Chest: Effort normal and breath sounds normal. No respiratory distress. She has no wheezes. She has no rales. She exhibits no tenderness.  Abdominal: Soft. Bowel sounds are normal. She exhibits no distension. There is no tenderness. There is no rebound.  Musculoskeletal: Normal range of motion. She exhibits no edema and no tenderness.  Lymphadenopathy:    She has no cervical adenopathy.  Neurological: She is alert and  oriented to person, place, and time.  Skin: Skin is warm and dry.  Psychiatric: She has a normal mood and affect. Her behavior is normal. Judgment and thought content normal.   ED Course  Procedures (including critical care time)  Labs Reviewed - No data to display No results found.  No diagnosis found.  MDM  IMPRESSION  CHF  Tachycardia / palpatations  Hypertension  Tobacco   RECOMMENDATIONS / PLAN Restart patient on her coreg 3.125 mg po bid Furosemide 20 mg po daily Losartan 25 mg po daily (pt reports allergy to benazepril) Low sodium diet Close follow up Daily weights recommended Written information dispensed.  CHF education with patient and spouse started I had a long discussion about beta blockers with patient today  FOLLOW UP 2 weeks for BP check with nurse 1 month   The patient was given clear  instructions to go to ER or return to medical center if symptoms don't improve, worsen or new problems develop.  The patient verbalized understanding.  The patient was told to call to get lab results if they haven't heard anything in the next week.            Cleora Fleet, MD 02/22/13 2023  Cleora Fleet, MD 02/22/13 2024

## 2013-02-22 NOTE — ED Notes (Signed)
Pt reports 2 day history of nausea with associated vomiting "a little bit".  Denied seeing blood in vomitus.

## 2013-02-22 NOTE — ED Notes (Signed)
Pt also here for med refills.

## 2013-02-25 ENCOUNTER — Encounter: Payer: Self-pay | Admitting: Family Medicine

## 2013-02-25 DIAGNOSIS — E559 Vitamin D deficiency, unspecified: Secondary | ICD-10-CM | POA: Insufficient documentation

## 2013-02-25 NOTE — Progress Notes (Signed)
Quick Note:  Please inform patient that her vitamin D level came back very low (less than 10). Please call in Drisdol 50,000 IU caps, take 1 cap every week, #8, no refills. Other labs came back OK. Recheck labs in 3 months.   Rodney Langton, MD, CDE, FAAFP [facility] [place], [place]   ______

## 2013-02-26 ENCOUNTER — Telehealth (HOSPITAL_COMMUNITY): Payer: Self-pay

## 2013-02-26 NOTE — ED Notes (Signed)
Patient notified of labs need to call back with which [facility] she uses

## 2013-02-26 NOTE — ED Notes (Signed)
Called to find out which [facility] patient uses- left message on machine to please return  Our call with the [facility] she uses

## 2013-08-31 ENCOUNTER — Emergency Department (HOSPITAL_COMMUNITY): Payer: Self-pay

## 2013-08-31 ENCOUNTER — Emergency Department (HOSPITAL_COMMUNITY)
Admission: EM | Admit: 2013-08-31 | Discharge: 2013-08-31 | Disposition: A | Discharge status: Home / Self Care | Payer: Self-pay | Attending: Emergency Medicine | Admitting: Emergency Medicine

## 2013-08-31 ENCOUNTER — Encounter (HOSPITAL_COMMUNITY): Payer: Self-pay | Admitting: Family Medicine

## 2013-08-31 DIAGNOSIS — I509 Heart failure, unspecified: Secondary | ICD-10-CM | POA: Insufficient documentation

## 2013-08-31 DIAGNOSIS — Z760 Encounter for issue of repeat prescription: Secondary | ICD-10-CM | POA: Insufficient documentation

## 2013-08-31 DIAGNOSIS — R42 Dizziness and giddiness: Secondary | ICD-10-CM | POA: Insufficient documentation

## 2013-08-31 DIAGNOSIS — R0602 Shortness of breath: Secondary | ICD-10-CM | POA: Insufficient documentation

## 2013-08-31 DIAGNOSIS — Z7982 Long term (current) use of aspirin: Secondary | ICD-10-CM | POA: Insufficient documentation

## 2013-08-31 DIAGNOSIS — I10 Essential (primary) hypertension: Secondary | ICD-10-CM | POA: Insufficient documentation

## 2013-08-31 DIAGNOSIS — Z91040 Latex allergy status: Secondary | ICD-10-CM | POA: Insufficient documentation

## 2013-08-31 DIAGNOSIS — Z79899 Other long term (current) drug therapy: Secondary | ICD-10-CM | POA: Insufficient documentation

## 2013-08-31 DIAGNOSIS — F17200 Nicotine dependence, unspecified, uncomplicated: Secondary | ICD-10-CM | POA: Insufficient documentation

## 2013-08-31 HISTORY — DX: Heart failure, unspecified: I50.9

## 2013-08-31 LAB — BASIC METABOLIC PANEL
BUN: 4 mg/dL — ABNORMAL LOW (ref 6–23)
Calcium: 9 mg/dL (ref 8.4–10.5)
GFR calc non Af Amer: >90 mL/min (ref >90)
Glucose, Bld: 95 mg/dL (ref 70–99)
Sodium: 132 mEq/L — ABNORMAL LOW (ref 135–145)

## 2013-08-31 LAB — CBC WITH DIFFERENTIAL/PLATELET
Eosinophils Absolute: 0.2 10*3/uL (ref 0.0–0.7)
Eosinophils Relative: 4 % (ref 0–5)
Lymphs Abs: 1.6 10*3/uL (ref 0.7–4.0)
MCH: 27.1 pg (ref 26.0–34.0)
MCHC: 33.7 g/dL (ref 30.0–36.0)
MCV: 80.5 fL (ref 78.0–100.0)
Platelets: 175 10*3/uL (ref 150–400)
RBC: 4.98 MIL/uL (ref 3.87–5.11)

## 2013-08-31 LAB — POCT I-STAT, CHEM 8
BUN: <3 mg/dL — ABNORMAL LOW (ref 6–23)
Calcium, Ion: 1.12 mmol/L (ref 1.12–1.23)
Creatinine, Ser: 0.7 mg/dL (ref 0.50–1.10)
Glucose, Bld: 89 mg/dL (ref 70–99)
Sodium: 137 mEq/L (ref 135–145)
TCO2: 20 mmol/L (ref 0–100)

## 2013-08-31 MED ORDER — CARVEDILOL 3.125 MG PO TABS
3.1250 mg | ORAL_TABLET | Freq: Once | ORAL | Status: AC
  Administered 2013-08-31: 3.125 mg via ORAL
  Filled 2013-08-31: qty 1

## 2013-08-31 MED ORDER — FUROSEMIDE 20 MG PO TABS
20.0000 mg | ORAL_TABLET | Freq: Once | ORAL | Status: AC
  Administered 2013-08-31: 20 mg via ORAL
  Filled 2013-08-31: qty 1

## 2013-08-31 MED ORDER — FUROSEMIDE 20 MG PO TABS: 20.0000 mg | ORAL_TABLET | Freq: Every day | ORAL | Status: DC

## 2013-08-31 MED ORDER — CARVEDILOL 3.125 MG PO TABS: 3.1250 mg | ORAL_TABLET | Freq: Two times a day (BID) | ORAL | Status: DC

## 2013-08-31 NOTE — ED Provider Notes (Signed)
CSN: [id number]     Arrival date & time 08/31/13  1318 History   First MD Initiated Contact with Patient 08/31/13 1438     Chief Complaint  Patient presents with  . Chest Pain  . Dizziness   (Consider location/radiation/quality/duration/timing/severity/associated sxs/prior Treatment) HPI  Patricia Mejia is a 52 y.o.female with a significant PMH of hypertension and CHF presents to the ER with complaints of CHF exacerbation and hypertension. She says that she ran out of her medication two days ago and has not taken her furosemide or carvedilol today or yesterday. She feels that since then she has developed SOB, abdominal swelling, and dizzy/light headed which she feels is consistent to when her CHF gets worse. She does not use oxygen at home and is taking at 98% in the ER. She endorses having discomfort laying flat and needing to sit up to breath better. She does not have chest pain but last endorses burning in her throat and esophagus.   Past Medical History  Diagnosis Date  . Hypertension   . CHF (congestive heart failure)     Diagnosed in 08/01/2012   Past Surgical History  Procedure Laterality Date  . Appendectomy     Family History  Problem Relation Age of Onset  . Alcohol abuse Mother   . Cirrhosis Mother   . Hypertension Mother   . Hypertension Father    History  Substance Use Topics  . Smoking status: Current Every Day Smoker -- 0.25 packs/day for 29 years    Types: Cigarettes  . Smokeless tobacco: Never Used  . Alcohol Use: Yes   OB History   Grav Para Term Preterm Abortions TAB SAB Ect Mult Living                 Review of Systems  Review of Systems  Gen: no weight loss, fevers, chills, night sweats  Eyes: no discharge or drainage, no occular pain or visual changes  Nose: no epistaxis or rhinorrhea  Mouth: no dental pain, no sore throat  Neck: no neck pain  Lungs:No wheezing, coughing or hemoptysis, + SOB CV: no chest pain, palpitations, dependent edema, +  orthopnea  Abd: no abdominal pain, nausea, vomiting  + abdominal swelling GU: no dysuria or gross hematuria  MSK:  No abnormalities  Neuro: no headache, no focal neurologic deficits + dizzy interrmitently Skin: no abnormalities Psyche: negative.   Allergies  Benazepril hcl and Latex  Home Medications   Current Outpatient Rx  Name  Route  Sig  Dispense  Refill  . aspirin EC 81 MG tablet   Oral   Take 81 mg by mouth daily.         . carvedilol (COREG) 3.125 MG tablet   Oral   Take 1 tablet (3.125 mg total) by mouth 2 (two) times daily with a meal.   60 tablet   1   . furosemide (LASIX) 20 MG tablet   Oral   Take 1 tablet (20 mg total) by mouth daily.   30 tablet   1    BP 154/97  Pulse 83  Temp(Src) 99.2 F (37.3 C) (Oral)  Resp 23  SpO2 100% Physical Exam  Nursing note and vitals reviewed. Constitutional: She is oriented to person, place, and time. She appears well-developed and well-nourished. No distress.  HENT:  Head: Normocephalic and atraumatic.  Eyes: Pupils are equal, round, and reactive to light.  Neck: Normal range of motion. Neck supple.  Cardiovascular: Normal rate and regular rhythm.  Pulmonary/Chest: Effort normal. No accessory muscle usage. She has no decreased breath sounds. She has no wheezes. She has no rhonchi. She has rales (mild).  No respiratory distress noted.  Abdominal: Soft. Bowel sounds are normal. She exhibits distension. She exhibits no shifting dullness, no fluid wave and no ascites. There is no tenderness.  Neurological: She is alert and oriented to person, place, and time. She has normal strength. No cranial nerve deficit or sensory deficit.  Skin: Skin is warm and dry.    ED Course  Procedures (including critical care time) Labs Review Labs Reviewed  BASIC METABOLIC PANEL - Abnormal; Notable for the following:    Sodium 132 (*)    BUN 4 (*)    All other components within normal limits  PRO B NATRIURETIC PEPTIDE -  Abnormal; Notable for the following:    Pro B Natriuretic peptide (BNP) 139.2 (*)    All other components within normal limits  POCT I-STAT, CHEM 8 - Abnormal; Notable for the following:    BUN <3 (*)    Hemoglobin 15.3 (*)    All other components within normal limits  CBC WITH DIFFERENTIAL   Imaging Review Dg Chest 2 View  08/31/2013   CLINICAL DATA:  Shortness of breath and dizziness  EXAM: CHEST  2 VIEW  COMPARISON:  August 01, 2012  FINDINGS: The lungs are clear. The heart is slightly enlarged with mild pulmonary venous hypertension. No adenopathy. No bone lesions.  IMPRESSION: The heart mildly enlarged with mild pulmonary venous hypertension. There may be a degree of early volume overload. No edema or consolidation.   Electronically Signed   By: Bretta Bang M.D.   On: 08/31/2013 15:54    MDM   1. Hypertension   2. Medication refill       Date: 08/31/2013  Rate: 99  Rhythm: sinus rhythm  QRS Axis: normal  Intervals: normal  ST/T Wave abnormalities: normal  Conduction Disutrbances:none  Narrative Interpretation:   Old EKG Reviewed: unchanged from Sept 3, 2013  Patients laboratory work is reassuring, BNP and chest xray do not show acute CHF. Her echo done recently did not diagnose her with CHF either. She was given her dose of Furosamide and Carvedilol in the ED and patient tells me she is feeling much better. Blood pressure is now much closer to normal. Discussed case with Dr. Effie Shy, will refill patients meds and have her f/u with PCP. PT says she has already found someone to help her pay for her medications which she will get and start to take tomorrow.  52 y.o.Patricia Mejia's evaluation in the Emergency Department is complete. It has been determined that no acute conditions requiring further emergency intervention are present at this time. The patient/guardian have been advised of the diagnosis and plan. We have discussed signs and symptoms that warrant return to the  ED, such as changes or worsening in symptoms.  Vital signs are stable at discharge. Filed Vitals:   08/31/13 1620  BP: 154/97  Pulse: 83  Temp:   Resp: 23    Patient/guardian has voiced understanding and agreed to follow-up with the PCP or specialist.     Dorthula Matas, PA-C 08/31/13 1638

## 2013-08-31 NOTE — ED Provider Notes (Signed)
Medical screening examination/treatment/procedure(s) were performed by non-physician practitioner and as supervising physician I was immediately available for consultation/collaboration.  Mekel Haverstock L Bronte Kropf, MD 08/31/13 1933 

## 2013-08-31 NOTE — ED Notes (Signed)
Pt presents from home by POV with c/o dizziness, lightheadedness, and weakness, states ran out of BP medication yesterday.  Pt also reports having burning pain in central chest/epigastric area last night at 0000, denies CP at this time. BP 171/112.

## 2013-08-31 NOTE — ED Notes (Signed)
Pt comfortable with d/c and f/u instructions. Prescriptions x2. 

## 2013-08-31 NOTE — ED Notes (Signed)
EDP made aware of BP, verbal order for I-stat Chem 8 and IV insertion at this time.

## 2015-01-17 ENCOUNTER — Observation Stay (HOSPITAL_BASED_OUTPATIENT_CLINIC_OR_DEPARTMENT_OTHER)
Admission: EM | Admit: 2015-01-17 | Discharge: 2015-01-21 | Disposition: A | Discharge status: Home / Self Care | Payer: Medicaid Other | Source: Home / Self Care | Attending: Internal Medicine | Admitting: Internal Medicine

## 2015-01-17 ENCOUNTER — Observation Stay (HOSPITAL_COMMUNITY): Payer: Medicaid Other

## 2015-01-17 ENCOUNTER — Encounter (HOSPITAL_COMMUNITY): Payer: Self-pay | Admitting: *Deleted

## 2015-01-17 DIAGNOSIS — D649 Anemia, unspecified: Secondary | ICD-10-CM | POA: Diagnosis present

## 2015-01-17 DIAGNOSIS — Z7982 Long term (current) use of aspirin: Secondary | ICD-10-CM

## 2015-01-17 DIAGNOSIS — F1020 Alcohol dependence, uncomplicated: Secondary | ICD-10-CM | POA: Insufficient documentation

## 2015-01-17 DIAGNOSIS — E669 Obesity, unspecified: Secondary | ICD-10-CM | POA: Insufficient documentation

## 2015-01-17 DIAGNOSIS — D500 Iron deficiency anemia secondary to blood loss (chronic): Secondary | ICD-10-CM

## 2015-01-17 DIAGNOSIS — R0602 Shortness of breath: Secondary | ICD-10-CM

## 2015-01-17 DIAGNOSIS — K922 Gastrointestinal hemorrhage, unspecified: Secondary | ICD-10-CM | POA: Diagnosis present

## 2015-01-17 DIAGNOSIS — K5180 Other ulcerative colitis without complications: Secondary | ICD-10-CM

## 2015-01-17 DIAGNOSIS — R739 Hyperglycemia, unspecified: Secondary | ICD-10-CM | POA: Diagnosis present

## 2015-01-17 DIAGNOSIS — E8809 Other disorders of plasma-protein metabolism, not elsewhere classified: Secondary | ICD-10-CM | POA: Diagnosis present

## 2015-01-17 DIAGNOSIS — G253 Myoclonus: Secondary | ICD-10-CM | POA: Diagnosis not present

## 2015-01-17 DIAGNOSIS — I509 Heart failure, unspecified: Secondary | ICD-10-CM | POA: Insufficient documentation

## 2015-01-17 DIAGNOSIS — Z66 Do not resuscitate: Secondary | ICD-10-CM | POA: Diagnosis not present

## 2015-01-17 DIAGNOSIS — G931 Anoxic brain damage, not elsewhere classified: Secondary | ICD-10-CM | POA: Diagnosis present

## 2015-01-17 DIAGNOSIS — E876 Hypokalemia: Secondary | ICD-10-CM

## 2015-01-17 DIAGNOSIS — E162 Hypoglycemia, unspecified: Secondary | ICD-10-CM | POA: Diagnosis present

## 2015-01-17 DIAGNOSIS — R680 Hypothermia, not associated with low environmental temperature: Secondary | ICD-10-CM | POA: Diagnosis present

## 2015-01-17 DIAGNOSIS — R945 Abnormal results of liver function studies: Secondary | ICD-10-CM | POA: Diagnosis present

## 2015-01-17 DIAGNOSIS — I10 Essential (primary) hypertension: Secondary | ICD-10-CM

## 2015-01-17 DIAGNOSIS — D62 Acute posthemorrhagic anemia: Secondary | ICD-10-CM | POA: Diagnosis present

## 2015-01-17 DIAGNOSIS — D509 Iron deficiency anemia, unspecified: Secondary | ICD-10-CM | POA: Diagnosis present

## 2015-01-17 DIAGNOSIS — E872 Acidosis: Secondary | ICD-10-CM | POA: Diagnosis present

## 2015-01-17 DIAGNOSIS — I469 Cardiac arrest, cause unspecified: Principal | ICD-10-CM | POA: Diagnosis present

## 2015-01-17 DIAGNOSIS — I5032 Chronic diastolic (congestive) heart failure: Secondary | ICD-10-CM | POA: Insufficient documentation

## 2015-01-17 DIAGNOSIS — Z6841 Body Mass Index (BMI) 40.0 and over, adult: Secondary | ICD-10-CM

## 2015-01-17 DIAGNOSIS — J690 Pneumonitis due to inhalation of food and vomit: Secondary | ICD-10-CM | POA: Diagnosis present

## 2015-01-17 DIAGNOSIS — I5189 Other ill-defined heart diseases: Secondary | ICD-10-CM | POA: Diagnosis present

## 2015-01-17 DIAGNOSIS — F17210 Nicotine dependence, cigarettes, uncomplicated: Secondary | ICD-10-CM | POA: Insufficient documentation

## 2015-01-17 DIAGNOSIS — E871 Hypo-osmolality and hyponatremia: Secondary | ICD-10-CM | POA: Diagnosis present

## 2015-01-17 DIAGNOSIS — Z8249 Family history of ischemic heart disease and other diseases of the circulatory system: Secondary | ICD-10-CM

## 2015-01-17 DIAGNOSIS — J9600 Acute respiratory failure, unspecified whether with hypoxia or hypercapnia: Secondary | ICD-10-CM | POA: Diagnosis present

## 2015-01-17 DIAGNOSIS — Z515 Encounter for palliative care: Secondary | ICD-10-CM

## 2015-01-17 DIAGNOSIS — R40243 Glasgow coma scale score 3-8: Secondary | ICD-10-CM | POA: Diagnosis present

## 2015-01-17 DIAGNOSIS — R7989 Other specified abnormal findings of blood chemistry: Secondary | ICD-10-CM | POA: Diagnosis present

## 2015-01-17 DIAGNOSIS — G9341 Metabolic encephalopathy: Secondary | ICD-10-CM | POA: Diagnosis present

## 2015-01-17 LAB — CBC WITH DIFFERENTIAL/PLATELET
BASOS PCT: 0 % (ref 0–1)
Basophils Absolute: 0 10*3/uL (ref 0.0–0.1)
EOS ABS: 0.1 10*3/uL (ref 0.0–0.7)
EOS PCT: 1 % (ref 0–5)
HCT: 22.5 % — ABNORMAL LOW (ref 36.0–46.0)
Hemoglobin: 6.4 g/dL — CL (ref 12.0–15.0)
LYMPHS PCT: 20 % (ref 12–46)
Lymphs Abs: 1.2 10*3/uL (ref 0.7–4.0)
MCH: 20.1 pg — ABNORMAL LOW (ref 26.0–34.0)
MCHC: 28.4 g/dL — ABNORMAL LOW (ref 30.0–36.0)
MCV: 70.5 fL — AB (ref 78.0–100.0)
MONOS PCT: 9 % (ref 3–12)
Monocytes Absolute: 0.5 10*3/uL (ref 0.1–1.0)
NEUTROS PCT: 70 % (ref 43–77)
Neutro Abs: 4 10*3/uL (ref 1.7–7.7)
Platelets: 166 10*3/uL (ref 150–400)
RBC: 3.19 MIL/uL — AB (ref 3.87–5.11)
RDW: 18.2 % — ABNORMAL HIGH (ref 11.5–15.5)
WBC: 5.8 10*3/uL (ref 4.0–10.5)

## 2015-01-17 LAB — COMPREHENSIVE METABOLIC PANEL
ALK PHOS: 100 U/L (ref 39–117)
ALT: 48 U/L — AB (ref 0–35)
AST: 71 U/L — ABNORMAL HIGH (ref 0–37)
Albumin: 3.4 g/dL — ABNORMAL LOW (ref 3.5–5.2)
Anion gap: 11 (ref 5–15)
BUN: <5 mg/dL — ABNORMAL LOW (ref 6–23)
CALCIUM: 9.2 mg/dL (ref 8.4–10.5)
CO2: 20 mmol/L (ref 19–32)
Chloride: 100 mmol/L (ref 96–112)
Creatinine, Ser: 0.7 mg/dL (ref 0.50–1.10)
GFR calc non Af Amer: >90 mL/min (ref >90)
GLUCOSE: 41 mg/dL — AB (ref 70–99)
POTASSIUM: 3.6 mmol/L (ref 3.5–5.1)
SODIUM: 131 mmol/L — AB (ref 135–145)
TOTAL PROTEIN: 7.7 g/dL (ref 6.0–8.3)
Total Bilirubin: 0.4 mg/dL (ref 0.3–1.2)

## 2015-01-17 LAB — CBG MONITORING, ED: Glucose-Capillary: 21 mg/dL — CL (ref 70–99)

## 2015-01-17 LAB — PROTIME-INR
INR: 1.12 (ref 0.00–1.49)
PROTHROMBIN TIME: 14.6 s (ref 11.6–15.2)

## 2015-01-17 LAB — RETICULOCYTES
RBC.: 2.97 MIL/uL — AB (ref 3.87–5.11)
Retic Count, Absolute: 59.4 10*3/uL (ref 19.0–186.0)
Retic Ct Pct: 2 % (ref 0.4–3.1)

## 2015-01-17 LAB — SAMPLE TO BLOOD BANK

## 2015-01-17 LAB — PREPARE RBC (CROSSMATCH)

## 2015-01-17 LAB — GLUCOSE, CAPILLARY
Glucose-Capillary: 137 mg/dL — ABNORMAL HIGH (ref 70–99)
Glucose-Capillary: 35 mg/dL — CL (ref 70–99)

## 2015-01-17 LAB — ABO/RH: ABO/RH(D): O NEG

## 2015-01-17 LAB — APTT: aPTT: 40 seconds — ABNORMAL HIGH (ref 24–37)

## 2015-01-17 MED ORDER — FUROSEMIDE 10 MG/ML IJ SOLN
20.0000 mg | Freq: Once | INTRAMUSCULAR | Status: AC
  Administered 2015-01-18: 20 mg via INTRAVENOUS
  Filled 2015-01-17: qty 2

## 2015-01-17 MED ORDER — SODIUM CHLORIDE 0.9 % IV SOLN
Freq: Once | INTRAVENOUS | Status: AC
  Administered 2015-01-18: 02:00:00 via INTRAVENOUS

## 2015-01-17 MED ORDER — PNEUMOCOCCAL VAC POLYVALENT 25 MCG/0.5ML IJ INJ: 0.5000 mL | INJECTION | INTRAMUSCULAR | Status: DC | PRN

## 2015-01-17 MED ORDER — SODIUM CHLORIDE 0.9 % IV SOLN: Freq: Once | INTRAVENOUS | Status: DC

## 2015-01-17 MED ORDER — ACETAMINOPHEN 325 MG PO TABS
650.0000 mg | ORAL_TABLET | Freq: Once | ORAL | Status: AC
  Administered 2015-01-18: 650 mg via ORAL
  Filled 2015-01-17: qty 2

## 2015-01-17 MED ORDER — DEXTROSE 50 % IV SOLN
INTRAVENOUS | Status: AC
  Administered 2015-01-18: 50 mL
  Filled 2015-01-17: qty 50

## 2015-01-17 MED ORDER — ALBUTEROL SULFATE (2.5 MG/3ML) 0.083% IN NEBU: 2.5000 mg | INHALATION_SOLUTION | RESPIRATORY_TRACT | Status: DC | PRN

## 2015-01-17 MED ORDER — ACETAMINOPHEN 650 MG RE SUPP: 650.0000 mg | Freq: Four times a day (QID) | RECTAL | Status: DC | PRN

## 2015-01-17 MED ORDER — CARVEDILOL 12.5 MG PO TABS
12.5000 mg | ORAL_TABLET | Freq: Two times a day (BID) | ORAL | Status: DC
  Administered 2015-01-18 – 2015-01-21 (×6): 12.5 mg via ORAL
  Filled 2015-01-17 (×9): qty 1

## 2015-01-17 MED ORDER — DIPHENHYDRAMINE HCL 25 MG PO CAPS
25.0000 mg | ORAL_CAPSULE | Freq: Once | ORAL | Status: AC
  Administered 2015-01-18: 25 mg via ORAL
  Filled 2015-01-17: qty 1

## 2015-01-17 MED ORDER — NICOTINE 14 MG/24HR TD PT24
14.0000 mg | MEDICATED_PATCH | Freq: Every day | TRANSDERMAL | Status: DC
  Administered 2015-01-18 – 2015-01-20 (×3): 14 mg via TRANSDERMAL
  Filled 2015-01-17 (×4): qty 1

## 2015-01-17 MED ORDER — ACETAMINOPHEN 325 MG PO TABS: 650.0000 mg | ORAL_TABLET | Freq: Four times a day (QID) | ORAL | Status: DC | PRN

## 2015-01-17 MED ORDER — ONDANSETRON HCL 4 MG/2ML IJ SOLN: 4.0000 mg | Freq: Four times a day (QID) | INTRAMUSCULAR | Status: DC | PRN

## 2015-01-17 MED ORDER — ONDANSETRON HCL 4 MG PO TABS: 4.0000 mg | ORAL_TABLET | Freq: Four times a day (QID) | ORAL | Status: DC | PRN

## 2015-01-17 MED ORDER — PANTOPRAZOLE SODIUM 40 MG IV SOLR
40.0000 mg | Freq: Two times a day (BID) | INTRAVENOUS | Status: DC
  Administered 2015-01-18 – 2015-01-20 (×6): 40 mg via INTRAVENOUS
  Filled 2015-01-17 (×7): qty 40

## 2015-01-17 MED ORDER — DEXTROSE 50 % IV SOLN
INTRAVENOUS | Status: AC
  Administered 2015-01-17: 50 mL
  Filled 2015-01-17: qty 50

## 2015-01-17 MED ORDER — FUROSEMIDE 10 MG/ML IJ SOLN
20.0000 mg | Freq: Once | INTRAMUSCULAR | Status: DC
  Filled 2015-01-17: qty 2

## 2015-01-17 MED ORDER — INFLUENZA VAC SPLIT QUAD 0.5 ML IM SUSY: 0.5000 mL | PREFILLED_SYRINGE | INTRAMUSCULAR | Status: DC | PRN

## 2015-01-17 NOTE — Progress Notes (Signed)
  Pt admitted to the unit. Pt is stable, alert and oriented per baseline. Oriented to room, staff, and call bell. Educated to call for any assistance. Bed in lowest position, call bell within reach- will continue to monitor. 

## 2015-01-17 NOTE — ED Notes (Signed)
Lab called with HGB 6.4

## 2015-01-17 NOTE — ED Provider Notes (Signed)
CSN: [id number]     Arrival date & time 01/17/15  1830 History   First MD Initiated Contact with Patient 01/17/15 2017     Chief Complaint  Patient presents with  . Abnormal Lab     (Consider location/radiation/quality/duration/timing/severity/associated sxs/prior Treatment) HPI Comments:  54 y.o. female with a history of hypertension and CHF who presents with anemia. She states that she had a checkup by her PCP yesterday and some screening lab work. She was called today and it was reported to her that she had a low hemoglobin and that she should go to the ER. Hemoglobin found to be 6.4 here. She denies any chest pain. She states that she has chronic shortness of breath which is unchanged from baseline. She is also had fatigue for the last year. She states that she saw some dark blood in her stool about 6 months ago but has not seen any since. The duration of her symptoms is constant. The severity of her symptoms is mild. She denies any other associated symptoms. Nothing has relieved her symptoms thus far.  The history is provided by the patient.    Past Medical History  Diagnosis Date  . Hypertension   . CHF (congestive heart failure)     Diagnosed in 08/01/2012   Past Surgical History  Procedure Laterality Date  . Appendectomy     Family History  Problem Relation Age of Onset  . Alcohol abuse Mother   . Cirrhosis Mother   . Hypertension Mother   . Hypertension Father    History  Substance Use Topics  . Smoking status: Current Every Day Smoker -- 0.25 packs/day for 29 years    Types: Cigarettes  . Smokeless tobacco: Never Used  . Alcohol Use: Yes   OB History    No data available     Review of Systems  Constitutional: Positive for fatigue. Negative for fever.  HENT: Negative for congestion and drooling.   Eyes: Negative for pain.  Respiratory: Negative for cough and shortness of breath.   Cardiovascular: Negative for chest pain.  Gastrointestinal: Negative for nausea,  vomiting, abdominal pain and diarrhea.  Genitourinary: Negative for dysuria and hematuria.  Musculoskeletal: Negative for back pain, gait problem and neck pain.  Skin: Negative for color change.  Neurological: Negative for dizziness and headaches.  Hematological: Negative for adenopathy.  Psychiatric/Behavioral: Negative for behavioral problems.  All other systems reviewed and are negative.     Allergies  Benazepril hcl and Latex  Home Medications   Prior to Admission medications   Medication Sig Start Date End Date Taking? Authorizing Provider  aspirin EC 81 MG tablet Take 81 mg by mouth daily.    Historical Provider, MD  carvedilol (COREG) 3.125 MG tablet Take 1 tablet (3.125 mg total) by mouth 2 (two) times daily with a meal. 08/31/13   Dorthula Matas, PA-C  furosemide (LASIX) 20 MG tablet Take 1 tablet (20 mg total) by mouth daily. 08/31/13 08/31/14  Tiffany Irine Seal, PA-C   BP 110/68 mmHg  Pulse 70  Temp(Src) 97.5 F (36.4 C) (Oral)  Resp 25  SpO2 100% Physical Exam  Constitutional: She is oriented to person, place, and time. She appears well-developed and well-nourished.  HENT:  Head: Normocephalic.  Mouth/Throat: Oropharynx is clear and moist. No oropharyngeal exudate.  Eyes: Conjunctivae and EOM are normal. Pupils are equal, round, and reactive to light.  Neck: Normal range of motion. Neck supple.  Cardiovascular: Normal rate, regular rhythm, normal heart sounds and  intact distal pulses.  Exam reveals no gallop and no friction rub.   No murmur heard. Pulmonary/Chest: Effort normal and breath sounds normal. No respiratory distress. She has no wheezes.  Abdominal: Soft. Bowel sounds are normal. There is no tenderness. There is no rebound and no guarding.  Genitourinary:  2 small external noninflamed hemorrhoids noted on rectal exam. Otherwise normal rectal exam with brown stool.  Musculoskeletal: Normal range of motion. She exhibits no edema or tenderness.   Neurological: She is alert and oriented to person, place, and time.  Skin: Skin is warm and dry.  Psychiatric: She has a normal mood and affect. Her behavior is normal.  Nursing note and vitals reviewed.   ED Course  Procedures (including critical care time) Labs Review Labs Reviewed  CBC WITH DIFFERENTIAL/PLATELET - Abnormal; Notable for the following:    RBC 3.19 (*)    Hemoglobin 6.4 (*)    HCT 22.5 (*)    MCV 70.5 (*)    MCH 20.1 (*)    MCHC 28.4 (*)    RDW 18.2 (*)    All other components within normal limits  COMPREHENSIVE METABOLIC PANEL - Abnormal; Notable for the following:    Sodium 131 (*)    Glucose, Bld 41 (*)    BUN <5 (*)    Albumin 3.4 (*)    AST 71 (*)    ALT 48 (*)    All other components within normal limits  APTT - Abnormal; Notable for the following:    aPTT 40 (*)    All other components within normal limits  CBG MONITORING, ED - Abnormal; Notable for the following:    Glucose-Capillary 21 (*)    All other components within normal limits  PROTIME-INR  VITAMIN B12  FOLATE  IRON AND TIBC  FERRITIN  RETICULOCYTES  OCCULT BLOOD X 1 CARD TO LAB, STOOL  SAMPLE TO BLOOD BANK    Imaging Review No results found.   EKG Interpretation None     CRITICAL CARE Performed by: Purvis SheffieldHARRISON, Nikcole Eischeid, S Total critical care time: 30 min Critical care time was exclusive of separately billable procedures and treating other patients. Critical care was necessary to treat or prevent imminent or life-threatening deterioration. Critical care was time spent personally by me on the following activities: development of treatment plan with patient and/or surrogate as well as nursing, discussions with consultants, evaluation of patient's response to treatment, examination of patient, obtaining history from patient or surrogate, ordering and performing treatments and interventions, ordering and review of laboratory studies, ordering and review of radiographic studies, pulse  oximetry and re-evaluation of patient's condition.  MDM   Final diagnoses:  Anemia, unspecified anemia type  Shortness of breath    8:51 PM 54 y.o. female with a history of hypertension and CHF who presents with anemia. She states that she had a checkup by her PCP yesterday and some screening lab work. She was called today and it was reported to her that she had a low hemoglobin and that she should go to the ER. Hemoglobin found to be 6.4 here. She denies any chest pain. She states that she has chronic shortness of breath which is unchanged from baseline. She is also had fatigue for the last year. She states that she saw some dark blood in her stool about 6 months ago but has not seen any since.she is afebrile and vital signs are unremarkable here. Will transfuse blood.   Hemeoccult Positive per our minilab. Results did not  show up in the computer for some reason.     Purvis Sheffield, MD 01/17/15 [phone]

## 2015-01-17 NOTE — ED Notes (Signed)
The pt is c/o  Being called from her doctors office that her hgb was low.  She was at her doctors office yesterday and had blood drawn.   Blood transfusions in the past for unknown reason

## 2015-01-17 NOTE — ED Notes (Signed)
Pt wheeled back to room. Pt placed into gown and on monitor upon arrival to room. Pt monitored by 5 lead, blood pressure, and pulse ox. Upon arrival to room pt stated she felt really dizzy, pt became diaphoretic alerted RN. RN to bedside.

## 2015-01-17 NOTE — H&P (Signed)
[facility] History and Physical  Patricia Mejia:[id number] DOB: 08-02-1961 DOA: 01/17/2015  PCP: Dr. August Saucer (?[facility])  Chief Complaint: Shortness of breath  HPI: Patricia Mejia is a 54 y.o. female who presented to the ED at [facility] on 01/17/2015, accompanied by her significant other.  She has a history of chronic diastolic heart failure, HTN, and active tobacco and EtOH use.  She reports chronic, intermittent SOB, which has worsened over the past 1-2 weeks.  She has DOE and says that she would not even think about taking stairs.  She reports increased fatigue and sweatiness over the past week.  She has been light-headed, but she has not passed out.  She denies chest pain.  No abdominal pain.  She reports at least two episodes of "bloody stool" over the past couple of months, but she denies melena.  She denies any abnormal vaginal bleeding.  She has never had a colonoscopy.  She reports being told that she was anemic in the past, and she was never compliant with iron supplementation as recommended.  Ironically, Hgb in this system one year ago was normal.  She reports a 9lb weight loss and decreased appetite over the past two months.  Review of Systems: 12 systems reviewed and negative except as stated in the HPI.  Past Medical History  Diagnosis Date  . Hypertension   . CHF (congestive heart failure)     Diagnosed in 08/01/2012  Chronic diastolic heart failure Self-reported prior history of anemia Obesity  Past Surgical History  Procedure Laterality Date  . Appendectomy     Social History:  History   Social History Narrative  Active tobacco use: 1/2 ppd Active EtOH use: 40oz malt liquor, 1-2 daily No illicit drug use She has one daughter Currently unemployed  Allergies  Allergen Reactions  . Benazepril Hcl     REACTION: angioedema  . Latex Itching and Rash    Family History  Problem Relation Age of Onset  . Alcohol abuse Mother   . Cirrhosis Mother   .  Hypertension Mother   . Hypertension Father   . Colon cancer Maternal Uncle   . Colon cancer Maternal Uncle    Prior to Admission medications   Medication Sig Start Date End Date Taking? Authorizing Provider  aspirin EC 81 MG tablet Take 81 mg by mouth daily.   Yes Historical Provider, MD  carvedilol (COREG) 12.5 MG tablet Take 12.5 mg by mouth 2 (two) times daily with a meal.   Yes Historical Provider, MD  furosemide (LASIX) 20 MG tablet Take 20 mg by mouth daily.   Yes Historical Provider, MD  Vitamin D 2000 units daily KCl daily  Physical Exam: Filed Vitals:   01/17/15 1859 01/17/15 2030 01/17/15 2045 01/17/15 2102  BP: 150/85 110/68 143/75 143/75  Pulse: 81 70 77 78  Temp: 97.5 F (36.4 C)     TempSrc: Oral     Resp: SpO2: 100% 100% 100% 100%     General:  Awake and alert, NAD  Eyes: PERRL bilaterally, muddy sclera, pale conjunctiva  ENT: Dry mucous membranes, no nasal drainage  Neck: Thick but supple  Cardiovascular: NR/RR  Respiratory: CTA bilaterally although patient C/O SOB  Abdomen: Protuberant but nontender.  Hypoactive bowel sounds  Skin: Dry  Musculoskeletal: Moves all four extremities spontaeously  Psychiatric: Affect appropriate  Neurologic: No focal deficits  Labs on Admission:  Basic Metabolic Panel:  Recent Labs Lab 01/17/15 1908  NA 131*  K 3.6  CL 100  CO2 20  GLUCOSE 41*  BUN <5*  CREATININE 0.70  CALCIUM 9.2   Liver Function Tests:  Recent Labs Lab 01/17/15 1908  AST 71*  ALT 48*  ALKPHOS 100  BILITOT 0.4  PROT 7.7  ALBUMIN 3.4*   CBC:  Recent Labs Lab 01/17/15 1908  WBC 5.8  NEUTROABS 4.0  HGB 6.4*  HCT 22.5*  MCV 70.5*  PLT 166   CBG:  Recent Labs Lab 01/17/15 2027  GLUCAP 21*   Assessment/Plan Active Problems:   Anemia Symptomatic anemia Abnormal LFTs Hypoalbuminemia Hyponatremia Hypoglycemia Daily EtOH use Active tobacco use Overdue for screening colonoscopy  1.  Symptomatic anemia, suspect this is a subacute process based on clinical history --Admit to med surg, observation, for transfusion.  2 units of PRBCs ordered with lasix 20mg  IV one time after each unit --Anemia panel ordered in the ED --Due for GI evaluation, will need to consider inpatient vs outpatient in the AM --Hold baby aspirin for now --IV protonix --Stool heme occult pending  2. Abnormal LFTs and hypoalbuminemia --Undoubtedly related to her EtOH history.  Cessation counseling provided.  Outpatient surveillance needed.  3. Hyponatremia, likely hypovolemic due to decreased PO intake --Caution with aggressive hydration due to hx of dCHF  4. Hypoglycemia --Accuchecks q4h for 24 hours.  RN to notify MD if blood sugar is less than 60.  Diet ordered for now.  Initial complaint was SOB.  Baseline chest xray and EKG reasonable.  Will order.  Low threshold for CIWA protocol if here longer than 24 hours due to history of daily EtOH use.  Code Status: FULL Disposition Plan: At least one midnight, depending on clinical response to initial therapies.  Time spent: 70 minutes  [employer]ellation BrandsCarter,Davarious Tumbleson Harrison [facility]  01/17/2015, 9:45 PM

## 2015-01-17 NOTE — ED Notes (Signed)
MD (Dr Littie DeedsGentry) aware of Hgb 6.4  Acuity to change to 2

## 2015-01-17 NOTE — Progress Notes (Signed)
Charge RN was completing the patients admission suicide risk assessment. The patient stated that she felt hopeless at times and does have thoughts of harming herself. House coverage, MD and staffing notified. Room checked and pt searched by security. Suicide sitter at bedside.

## 2015-01-18 DIAGNOSIS — E871 Hypo-osmolality and hyponatremia: Secondary | ICD-10-CM | POA: Diagnosis present

## 2015-01-18 DIAGNOSIS — E8809 Other disorders of plasma-protein metabolism, not elsewhere classified: Secondary | ICD-10-CM | POA: Diagnosis present

## 2015-01-18 DIAGNOSIS — E162 Hypoglycemia, unspecified: Secondary | ICD-10-CM | POA: Diagnosis present

## 2015-01-18 DIAGNOSIS — R945 Abnormal results of liver function studies: Secondary | ICD-10-CM | POA: Diagnosis present

## 2015-01-18 DIAGNOSIS — D62 Acute posthemorrhagic anemia: Secondary | ICD-10-CM | POA: Diagnosis present

## 2015-01-18 DIAGNOSIS — R7989 Other specified abnormal findings of blood chemistry: Secondary | ICD-10-CM | POA: Diagnosis present

## 2015-01-18 DIAGNOSIS — F1020 Alcohol dependence, uncomplicated: Secondary | ICD-10-CM | POA: Diagnosis present

## 2015-01-18 DIAGNOSIS — I509 Heart failure, unspecified: Secondary | ICD-10-CM

## 2015-01-18 LAB — GLUCOSE, CAPILLARY
GLUCOSE-CAPILLARY: 67 mg/dL — AB (ref 70–99)
GLUCOSE-CAPILLARY: 85 mg/dL (ref 70–99)
Glucose-Capillary: 132 mg/dL — ABNORMAL HIGH (ref 70–99)
Glucose-Capillary: 135 mg/dL — ABNORMAL HIGH (ref 70–99)
Glucose-Capillary: 174 mg/dL — ABNORMAL HIGH (ref 70–99)
Glucose-Capillary: 179 mg/dL — ABNORMAL HIGH (ref 70–99)
Glucose-Capillary: 34 mg/dL — CL (ref 70–99)
Glucose-Capillary: 45 mg/dL — ABNORMAL LOW (ref 70–99)
Glucose-Capillary: 63 mg/dL — ABNORMAL LOW (ref 70–99)
Glucose-Capillary: 75 mg/dL (ref 70–99)

## 2015-01-18 LAB — COMPREHENSIVE METABOLIC PANEL
ALK PHOS: 105 U/L (ref 39–117)
ALT: 46 U/L — ABNORMAL HIGH (ref 0–35)
ALT: 37 U/L — ABNORMAL HIGH (ref 0–35)
ANION GAP: 8 (ref 5–15)
ANION GAP: 8 (ref 5–15)
AST: 73 U/L — ABNORMAL HIGH (ref 0–37)
AST: 36 U/L (ref 0–37)
Albumin: 3.3 g/dL — ABNORMAL LOW (ref 3.5–5.2)
Albumin: 3 g/dL — ABNORMAL LOW (ref 3.5–5.2)
Alkaline Phosphatase: 93 U/L (ref 39–117)
BUN: <5 mg/dL — ABNORMAL LOW (ref 6–23)
BUN: 6 mg/dL (ref 6–23)
CO2: 25 mmol/L (ref 19–32)
CO2: 25 mmol/L (ref 19–32)
Calcium: 8.8 mg/dL (ref 8.4–10.5)
Calcium: 8.6 mg/dL (ref 8.4–10.5)
Chloride: 100 mmol/L (ref 96–112)
Chloride: 99 mmol/L (ref 96–112)
Creatinine, Ser: 0.81 mg/dL (ref 0.50–1.10)
Creatinine, Ser: 1.08 mg/dL (ref 0.50–1.10)
GFR calc Af Amer: >90 mL/min (ref >90)
GFR calc Af Amer: 67 mL/min — ABNORMAL LOW (ref >90)
GFR calc non Af Amer: 81 mL/min — ABNORMAL LOW (ref >90)
GFR calc non Af Amer: 58 mL/min — ABNORMAL LOW (ref >90)
Glucose, Bld: 59 mg/dL — ABNORMAL LOW (ref 70–99)
Glucose, Bld: 122 mg/dL — ABNORMAL HIGH (ref 70–99)
POTASSIUM: 3.8 mmol/L (ref 3.5–5.1)
Potassium: 3.2 mmol/L — ABNORMAL LOW (ref 3.5–5.1)
SODIUM: 132 mmol/L — AB (ref 135–145)
Sodium: 133 mmol/L — ABNORMAL LOW (ref 135–145)
TOTAL PROTEIN: 7 g/dL (ref 6.0–8.3)
Total Bilirubin: 1.8 mg/dL — ABNORMAL HIGH (ref 0.3–1.2)
Total Bilirubin: 0.7 mg/dL (ref 0.3–1.2)
Total Protein: 7.3 g/dL (ref 6.0–8.3)

## 2015-01-18 LAB — FERRITIN: Ferritin: 9 ng/mL — ABNORMAL LOW (ref 10–291)

## 2015-01-18 LAB — CBC WITH DIFFERENTIAL/PLATELET
BASOS ABS: 0 10*3/uL (ref 0.0–0.1)
BASOS PCT: 1 % (ref 0–1)
EOS ABS: 0 10*3/uL (ref 0.0–0.7)
Eosinophils Relative: 1 % (ref 0–5)
HCT: 26.8 % — ABNORMAL LOW (ref 36.0–46.0)
HEMOGLOBIN: 8.3 g/dL — AB (ref 12.0–15.0)
Lymphocytes Relative: 31 % (ref 12–46)
Lymphs Abs: 1.9 10*3/uL (ref 0.7–4.0)
MCH: 22.8 pg — AB (ref 26.0–34.0)
MCHC: 31 g/dL (ref 30.0–36.0)
MCV: 73.6 fL — ABNORMAL LOW (ref 78.0–100.0)
Monocytes Absolute: 0.5 10*3/uL (ref 0.1–1.0)
Monocytes Relative: 9 % (ref 3–12)
NEUTROS PCT: 59 % (ref 43–77)
Neutro Abs: 3.5 10*3/uL (ref 1.7–7.7)
PLATELETS: 149 10*3/uL — AB (ref 150–400)
RBC: 3.64 MIL/uL — ABNORMAL LOW (ref 3.87–5.11)
RDW: 18.9 % — AB (ref 11.5–15.5)
WBC: 5.9 10*3/uL (ref 4.0–10.5)

## 2015-01-18 LAB — LIPID PANEL
Cholesterol: 117 mg/dL (ref 0–200)
HDL: 57 mg/dL (ref >39)
LDL Cholesterol: 47 mg/dL (ref 0–99)
Total CHOL/HDL Ratio: 2.1 RATIO
Triglycerides: 64 mg/dL (ref <150)
VLDL: 13 mg/dL (ref 0–40)

## 2015-01-18 LAB — MAGNESIUM: Magnesium: 2.1 mg/dL (ref 1.5–2.5)

## 2015-01-18 LAB — IRON AND TIBC: UIBC: 359 ug/dL (ref 125–400)

## 2015-01-18 LAB — VITAMIN B12: VITAMIN B 12: 501 pg/mL (ref 211–911)

## 2015-01-18 LAB — FOLATE: Folate: 6.2 ng/mL

## 2015-01-18 MED ORDER — LORAZEPAM 1 MG PO TABS: 1.0000 mg | ORAL_TABLET | Freq: Four times a day (QID) | ORAL | Status: DC | PRN

## 2015-01-18 MED ORDER — VITAMIN B-1 100 MG PO TABS
100.0000 mg | ORAL_TABLET | Freq: Every day | ORAL | Status: DC
  Administered 2015-01-18 – 2015-01-21 (×4): 100 mg via ORAL
  Filled 2015-01-18 (×4): qty 1

## 2015-01-18 MED ORDER — GLUCOSE-VITAMIN C 4-6 GM-MG PO CHEW
CHEWABLE_TABLET | ORAL | Status: AC
  Filled 2015-01-18: qty 1

## 2015-01-18 MED ORDER — FOLIC ACID 1 MG PO TABS
1.0000 mg | ORAL_TABLET | Freq: Every day | ORAL | Status: DC
  Administered 2015-01-18 – 2015-01-21 (×4): 1 mg via ORAL
  Filled 2015-01-18 (×4): qty 1

## 2015-01-18 MED ORDER — PEG 3350-KCL-NA BICARB-NACL 420 G PO SOLR
4000.0000 mL | Freq: Once | ORAL | Status: AC
  Administered 2015-01-19: 4000 mL via ORAL
  Filled 2015-01-18: qty 4000

## 2015-01-18 MED ORDER — DEXTROSE 50 % IV SOLN
INTRAVENOUS | Status: AC
  Administered 2015-01-18: 50 mL
  Filled 2015-01-18: qty 50

## 2015-01-18 MED ORDER — THIAMINE HCL 100 MG/ML IJ SOLN
100.0000 mg | Freq: Every day | INTRAMUSCULAR | Status: DC
  Filled 2015-01-18 (×3): qty 1

## 2015-01-18 MED ORDER — LORAZEPAM 2 MG/ML IJ SOLN: 1.0000 mg | Freq: Four times a day (QID) | INTRAMUSCULAR | Status: DC | PRN

## 2015-01-18 MED ORDER — DEXTROSE 5 % IV SOLN: INTRAVENOUS | Status: DC

## 2015-01-18 MED ORDER — DEXTROSE 5 % IV SOLN
INTRAVENOUS | Status: DC
  Administered 2015-01-18: 01:00:00 via INTRAVENOUS

## 2015-01-18 MED ORDER — ADULT MULTIVITAMIN W/MINERALS CH
1.0000 | ORAL_TABLET | Freq: Every day | ORAL | Status: DC
  Administered 2015-01-18 – 2015-01-21 (×4): 1 via ORAL
  Filled 2015-01-18 (×4): qty 1

## 2015-01-18 MED ORDER — DEXTROSE 50 % IV SOLN: 1.0000 | Freq: Once | INTRAVENOUS | Status: DC

## 2015-01-18 MED ORDER — DEXTROSE-NACL 5-0.9 % IV SOLN
INTRAVENOUS | Status: DC
  Administered 2015-01-18 – 2015-01-19 (×2): via INTRAVENOUS

## 2015-01-18 MED ORDER — FUROSEMIDE 10 MG/ML IJ SOLN
20.0000 mg | Freq: Once | INTRAMUSCULAR | Status: AC
  Administered 2015-01-18: 20 mg via INTRAVENOUS

## 2015-01-18 MED ORDER — DEXTROSE 5 % IV SOLN
INTRAVENOUS | Status: DC
  Administered 2015-01-18: 07:00:00 via INTRAVENOUS

## 2015-01-18 NOTE — Progress Notes (Signed)
Rechecked with lab about blood being positive for duffy B antigen- lab tech states that this is ok for this patient. Will hang blood and monitor patient.

## 2015-01-18 NOTE — Progress Notes (Signed)
HYPOGLYCEMIC PROTOCOL:  Patient was assessed. CBG 35. Given D50- whole tube. Rechecked in about 15 minutes. CBG was 174. NP Craige CottaKirby notified. Patient was not pale, shaky, or sweating. Patient stated that she was just fine and just wanted to sleep. CBG will be monitored q4- will initiate protocol if needed again. Will continue to monitor blood sugar.

## 2015-01-18 NOTE — Progress Notes (Signed)
Patient's B/P 90/44 Coreg held, Dr. Joseph ArtWoods notified. Will monitor.

## 2015-01-18 NOTE — Progress Notes (Signed)
[facility] PROGRESS NOTE  Patricia Mejia ZOX:[id number] DOB: 1961-03-16 DOA: 01/17/2015 PCP: No primary care provider on file.  Assessment/Plan:  GI bleed/Symptomatic anemia/acute blood loss anemia; -Most likely multifactorial to include hemorrhoids, substance abuse, and patient never having screening colonoscopy. --2/19 transfused 2 units of PRBCs  --Anemia panel ordered in the ED --GI consult pending, counseled patient most likely will have a colonoscopy but will await recommendations from GI  --IV protonix 40 mg BID --Stool heme occult pending  Abnormal LFTs and hypoalbuminemia --Undoubtedly related to her EtOH history. Cessation counseling provided. Outpatient surveillance needed.  CHF? -Echocardiogram pending; patient at high risk with alcoholism  Hyponatremia -likely hypovolemic due to decreased PO intake -D5 0.9% saline at 75 ml/hr   Hypoglycemia -See hyponatremia  Alcoholism -Placed on CIWA  Protocol -Echocardiogram   Code Status: Full Family Communication: None Disposition Plan: Resolution GI bleed   Consultants: GI consult pending  Procedures: NA  Cultures NA  Antibiotics: NA   DVT prophylaxis SCD   HPI/Subjective: Patricia Mejia is a 54 y.o. BF PMHx HTN, chronic diastolic CHF, morbid obesity, alcoholism (40oz malt liquor, 1-2 daily), who presented to the ED at [facility] on 01/17/2015, accompanied by her significant other. She has a history of chronic diastolic heart failure, HTN, and active tobacco and EtOH use. She reports chronic, intermittent SOB, which has worsened over the past 1-2 weeks. She has DOE and says that she would not even think about taking stairs. She reports increased fatigue and sweatiness over the past week. She has been light-headed, but she has not passed out. She denies chest pain. No abdominal pain. She reports at least two episodes of "bloody stool" over the past couple of months, but she denies melena. She  denies any abnormal vaginal bleeding. She has never had a colonoscopy.  She reports being told that she was anemic in the past, and she was never compliant with iron supplementation as recommended. Ironically, Hgb in this system one year ago was normal. She reports a 9lb weight loss and decreased appetite over the past two months. 2/20 A/O 4, states noticed blood in her stool approximately 6 months ago however thought it was from her hemorrhoids therefore did not pursue. States has never seen a gastroenterologist. States believes her dry weight~150 pounds.   Objective: Filed Vitals:   01/18/15 0854 01/18/15 1042 01/18/15 1044 01/18/15 1418  BP: 112/63  Pulse: 56 86  87  Temp:  98.4 F (36.9 C) 98.4 F (36.9 C) 99.2 F (37.3 C)  TempSrc:  Oral Oral   Resp:    20  Height:      Weight:      SpO2:  100%  100%    Intake/Output Summary (Last 24 hours) at 01/18/15 1600 Last data filed at 01/18/15 1420  Gross per 24 hour  Intake   1510 ml  Output   4600 ml  Net  -3090 ml   Filed Weights   01/17/15 2246  Weight: 107.276 kg (236 lb 8 oz)     Exam: General: A/O 4, NAD, No acute respiratory distress Lungs: Clear to auscultation bilaterally without wheezes or crackles Cardiovascular: Regular rate and rhythm without murmur gallop or rub normal S1 and S2 Abdomen: Nontender, nondistended, soft, bowel sounds positive, no rebound, no ascites, no appreciable mass Extremities: No significant cyanosis, clubbing, or edema bilateral lower extremities   Data Reviewed: Basic Metabolic Panel:  Recent Labs Lab 01/17/15 1908 01/18/15 0952  NA 131* 133*  K 3.6 3.8  CL 100 100  CO2 20 25  GLUCOSE 41* 59*  BUN <5* <5*  CREATININE 0.70 0.81  CALCIUM 9.2 8.8   Liver Function Tests:  Recent Labs Lab 01/17/15 1908 01/18/15 0952  AST 71* 73*  ALT 48* 46*  ALKPHOS 100 105  BILITOT 0.4 1.8*  PROT 7.7 7.3  ALBUMIN 3.4* 3.3*   No results for input(s): LIPASE,  AMYLASE in the last 168 hours. No results for input(s): AMMONIA in the last 168 hours. CBC:  Recent Labs Lab 01/17/15 1908  WBC 5.8  NEUTROABS 4.0  HGB 6.4*  HCT 22.5*  MCV 70.5*  PLT 166   Cardiac Enzymes: No results for input(s): CKTOTAL, CKMB, CKMBINDEX, TROPONINI in the last 168 hours. BNP (last 3 results) No results for input(s): BNP in the last 8760 hours.  ProBNP (last 3 results) No results for input(s): PROBNP in the last 8760 hours.  CBG:  Recent Labs Lab 01/18/15 0733 01/18/15 0751 01/18/15 0817 01/18/15 0845 01/18/15 1149  GLUCAP 34* 67* 63* 75 85    No results found for this or any previous visit (from the past 240 hour(s)).   Studies: Dg Chest Port 1 View  01/18/2015   CLINICAL DATA:  Dyspnea  EXAM: PORTABLE CHEST - 1 VIEW  COMPARISON:  08/31/2013  FINDINGS: A single AP portable view of the chest demonstrates no focal airspace consolidation or alveolar edema. The lungs are grossly clear. There is no large effusion or pneumothorax. There is unchanged cardiomegaly. Cardiac and mediastinal contours are otherwise unremarkable.  IMPRESSION: Unchanged cardiomegaly.  No acute findings are evident.   Electronically Signed   By: Ellery Plunkaniel R Mitchell M.D.   On: 01/18/2015 00:12    Scheduled Meds: . carvedilol  12.5 mg Oral BID WC  . furosemide  20 mg Intravenous Once  . nicotine  14 mg Transdermal Daily  . pantoprazole (PROTONIX) IV  40 mg Intravenous Q12H   Continuous Infusions: . dextrose 75 mL/hr at 01/18/15 0710    Active Problems:   Anemia   Symptomatic anemia    Time spent: 40 min    Verina Galeno, J  [facility] Pager [phone]. If 7PM-7AM, please contact night-coverage at [email or web address], password [email or web address] 01/18/2015, 4:00 PM     Care during the described time interval was provided by me .  I have reviewed this patient's available data, including medical history, events of note, physical examination, radiology studies and test results as part  of my evaluation  Carolyne Littlesurtis Lana Flaim, MD [phone] Pager

## 2015-01-18 NOTE — Progress Notes (Signed)
Patient does not mind wearing her SCD but with the situation of being a suicide patient that can not have any long cords in the room-patient and RN made a deal. Patient will walk when disconnected from IV pool. Patient educated about scd's.

## 2015-01-18 NOTE — Progress Notes (Addendum)
Hypoglycemic Event  CBG: 37   Treatment: 15 GM carbohydrate snack  Symptoms: Vision changes  Follow-up CBG: Time:0750 CBG Result:67  Possible Reasons for Event: Unknown  Comments/MD notified:Will notify Dr. Joseph ArtWoods   CBG 75 @ 0845 Dr. Joseph ArtWoods aware   Patricia Mejia Patricia Mejia  Remember to initiate Hypoglycemia Order Set & complete

## 2015-01-18 NOTE — Progress Notes (Signed)
Hypoglycemic Event  CBG: 45  Treatment: D50 IV 50 mL  Symptoms: None  Follow-up CBG: Time:0351 CBG Result:179  Possible Reasons for Event: Inadequate meal intake  Comments/MD notified:NP Chriss DriverKirby    Gardiner, Braulio ConteCaedora M  Remember to initiate Hypoglycemia Order Set & complete

## 2015-01-18 NOTE — Progress Notes (Signed)
Nutrition Brief Note  Patient identified on the Malnutrition Screening Tool (MST) Report. Presents with anemia. Hx includes CHF,HTN. She is a smoker and consumes ETOH.  Wt Readings from Last 15 Encounters:  01/17/15 236 lb 8 oz (107.276 kg)  05/13/10 218 lb (98.884 kg)  02/13/10 218 lb 14.4 oz (99.292 kg)  05/20/09 218 lb (98.884 kg)  12/08/07 223 lb (101.152 kg)    Body mass index is 40.58 kg/(m^2). Patient meets criteria for obesity class III based on current BMI. Hx of CHF likely contributing to weight fluctuation. Pt says she doesn't weight herself at home.  Current diet order is regular, patient is consuming approximately 50-75% of meals at this time. Pt says she follows low sodium diet at home. Labs and medications reviewed.  Pt would benefit from weight loss counseling. Recommend [facility] be [facility] at discharge.  Patricia ShiversLynn Yitzel Shasteen MS,RD,CSG,LDN Office: [phone] Pager: [phone]

## 2015-01-18 NOTE — Progress Notes (Signed)
HYPOGLYCEMIC PROTOCOL:  Patient was assessed. CBG 45. Given D50- whole tube. Rechecked in about 15 minutes. CBG was 179. NP Craige CottaKirby notified. Patient was not pale, shaky, or sweating. Patient stated that she was just fine. CBG will be monitored q4- will initiate protocol if needed again. Will continue to monitor blood sugar.

## 2015-01-18 NOTE — Progress Notes (Signed)
Hypoglycemic Event  CBG: 35  Treatment: D50 IV 50 mL  Symptoms: None  Follow-up CBG: Time:0008 CBG Result:174  Possible Reasons for Event: Inadequate meal intake  Comments/MD notified:NP Randa NgoKirby    Patricia Mejia, Patricia Mejia, Patricia House GARDINER, RN  Remember to initiate Hypoglycemia Order Set & complete

## 2015-01-18 NOTE — Consult Note (Signed)
[facility] Reason for consult: Marked Anemia Referring Physician: [facility]. PCP: Dr. Kevan Ny. Primary G.I.: none patient is unassigned  Patricia Mejia is an 54 y.o. female.  HPI: she was admitted to the hospital with shortness of breath. She has a history of chronic heart failure arthritic pains in her legs and has been having some bright blood around her bowel movements that she is seen for several weeks that she is attributed hemorrhoids. She is not had any bleeding. She has had a weight loss over the past several months. She says she's been quite depressed because her sister died unexpectedly of heart failure. She is quite frustrated and has had thoughts of suicide and is on suicide precautions. Patient has never had any type of colonoscopy or endoscopy. She has chronic heartburn that can be quite severe usually at night for which he takes antacids that she buys OTC. She reports that she has had 2 uncles that it had colon cancer and her grandson apparently had colon polyps removed at age 59.  Past Medical History  Diagnosis Date  . Hypertension   . CHF (congestive heart failure)     Diagnosed in 08/01/2012    Past Surgical History  Procedure Laterality Date  . Appendectomy      Family History  Problem Relation Age of Onset  . Alcohol abuse Mother   . Cirrhosis Mother   . Hypertension Mother   . Hypertension Father   . Colon cancer Maternal Uncle   . Colon cancer Maternal Uncle     Social History:  reports that she has been smoking Cigarettes.  She has a 14.5 pack-year smoking history. She has never used smokeless tobacco. She reports that she drinks alcohol. She reports that she does not use illicit drugs.  Allergies:  Allergies  Allergen Reactions  . Benazepril Hcl     REACTION: angioedema  . Latex Itching and Rash    Medications; Prior to Admission medications   Medication Sig Start Date End Date Taking? Authorizing Provider  aspirin EC 81  MG tablet Take 81 mg by mouth daily.   Yes Historical Provider, MD  carvedilol (COREG) 12.5 MG tablet Take 12.5 mg by mouth 2 (two) times daily with a meal.   Yes Historical Provider, MD  cholecalciferol (VITAMIN D) 1000 UNITS tablet Take 2,000 Units by mouth daily.   Yes Historical Provider, MD  furosemide (LASIX) 20 MG tablet Take 20 mg by mouth daily.   Yes Historical Provider, MD  potassium chloride SA (K-DUR,KLOR-CON) 20 MEQ tablet Take 20 mEq by mouth daily.   Yes Historical Provider, MD  furosemide (LASIX) 20 MG tablet Take 1 tablet (20 mg total) by mouth daily. 08/31/13 08/31/14  Linus Mako, PA-C   . carvedilol  12.5 mg Oral BID WC  . folic acid  1 mg Oral Daily  . furosemide  20 mg Intravenous Once  . multivitamin with minerals  1 tablet Oral Daily  . nicotine  14 mg Transdermal Daily  . pantoprazole (PROTONIX) IV  40 mg Intravenous Q12H  . thiamine  100 mg Oral Daily   Or  . thiamine  100 mg Intravenous Daily   PRN Meds acetaminophen **OR** acetaminophen, albuterol, Influenza vac split quadrivalent PF, LORazepam **OR** LORazepam, ondansetron **OR** ondansetron (ZOFRAN) IV, pneumococcal 23 valent vaccine Results for orders placed or performed during the hospital encounter of 01/17/15 (from the past 48 hour(s))  CBC with Differential     Status: Abnormal   Collection  Time: 01/17/15  7:08 PM  Result Value Ref Range   WBC 5.8 4.0 - 10.5 K/uL   RBC 3.19 (L) 3.87 - 5.11 MIL/uL   Hemoglobin 6.4 (LL) 12.0 - 15.0 g/dL    Comment: REPEATED TO VERIFY CRITICAL RESULT CALLED TO, READ BACK BY AND VERIFIED WITH: JACOBELLI,B RN $RemoveBefore'[id number]$  2.19.16 BY GRINSTEAD,C    HCT 22.5 (L) 36.0 - 46.0 %   MCV 70.5 (L) 78.0 - 100.0 fL   MCH 20.1 (L) 26.0 - 34.0 pg   MCHC 28.4 (L) 30.0 - 36.0 g/dL   RDW 18.2 (H) 11.5 - 15.5 %   Platelets 166 150 - 400 K/uL    Comment: PLATELET COUNT CONFIRMED BY SMEAR   Neutrophils Relative % 70 43 - 77 %   Lymphocytes Relative 20 12 - 46 %   Monocytes Relative 9 3  - 12 %   Eosinophils Relative 1 0 - 5 %   Basophils Relative 0 0 - 1 %   Neutro Abs 4.0 1.7 - 7.7 K/uL   Lymphs Abs 1.2 0.7 - 4.0 K/uL   Monocytes Absolute 0.5 0.1 - 1.0 K/uL   Eosinophils Absolute 0.1 0.0 - 0.7 K/uL   Basophils Absolute 0.0 0.0 - 0.1 K/uL   RBC Morphology POLYCHROMASIA PRESENT     Comment: ELLIPTOCYTES  Comprehensive metabolic panel     Status: Abnormal   Collection Time: 01/17/15  7:08 PM  Result Value Ref Range   Sodium 131 (L) 135 - 145 mmol/L   Potassium 3.6 3.5 - 5.1 mmol/L   Chloride 100 96 - 112 mmol/L   CO2 20 19 - 32 mmol/L   Glucose, Bld 41 (LL) 70 - 99 mg/dL    Comment: REPEATED TO VERIFY CRITICAL RESULT CALLED TO, READ BACK BY AND VERIFIED WITH: J FERRIANOLO,RN 2014 01/17/15 D BRADLEY    BUN <5 (L) 6 - 23 mg/dL   Creatinine, Ser 0.70 0.50 - 1.10 mg/dL   Calcium 9.2 8.4 - 10.5 mg/dL   Total Protein 7.7 6.0 - 8.3 g/dL   Albumin 3.4 (L) 3.5 - 5.2 g/dL   AST 71 (H) 0 - 37 U/L   ALT 48 (H) 0 - 35 U/L   Alkaline Phosphatase 100 39 - 117 U/L   Total Bilirubin 0.4 0.3 - 1.2 mg/dL   GFR calc non Af Amer >90 >90 mL/min   GFR calc Af Amer >90 >90 mL/min    Comment: (NOTE) The eGFR has been calculated using the CKD EPI equation. This calculation has not been validated in all clinical situations. eGFR's persistently <90 mL/min signify possible Chronic Kidney Disease.    Anion gap 11 5 - 15  Protime-INR     Status: None   Collection Time: 01/17/15  7:08 PM  Result Value Ref Range   Prothrombin Time 14.6 11.6 - 15.2 seconds   INR 1.12 0.00 - 1.49  APTT     Status: Abnormal   Collection Time: 01/17/15  7:08 PM  Result Value Ref Range   aPTT 40 (H) 24 - 37 seconds    Comment:        IF BASELINE aPTT IS ELEVATED, SUGGEST PATIENT RISK ASSESSMENT BE USED TO DETERMINE APPROPRIATE ANTICOAGULANT THERAPY.   Sample to Blood Bank     Status: None   Collection Time: 01/17/15  7:09 PM  Result Value Ref Range   Blood Bank Specimen SAMPLE AVAILABLE FOR TESTING     Sample Expiration 01/18/2015   Type and screen  Status: None (Preliminary result)   Collection Time: 01/17/15  7:09 PM  Result Value Ref Range   ABO/RH(D) O NEG    Antibody Screen NEG    Sample Expiration 01/20/2015    Unit Number [id number]    Blood Component Type RBC CPDA1, LR    Unit division 00    Status of Unit ISSUED    Transfusion Status OK TO TRANSFUSE    Crossmatch Result Compatible    Unit Number [id number]    Blood Component Type RED CELLS,LR    Unit division 00    Status of Unit ISSUED    Transfusion Status OK TO TRANSFUSE    Crossmatch Result Compatible   ABO/Rh     Status: None   Collection Time: 01/17/15  7:09 PM  Result Value Ref Range   ABO/RH(D) O NEG   CBG monitoring, ED     Status: Abnormal   Collection Time: 01/17/15  8:27 PM  Result Value Ref Range   Glucose-Capillary 21 (LL) 70 - 99 mg/dL  Vitamin B12     Status: None   Collection Time: 01/17/15  8:49 PM  Result Value Ref Range   Vitamin B-12 501 211 - 911 pg/mL    Comment: Performed at [facility]  Folate     Status: None   Collection Time: 01/17/15  8:49 PM  Result Value Ref Range   Folate 6.2 ng/mL    Comment: (NOTE) Reference Ranges        Deficient:       0.4 - 3.3 ng/mL        Indeterminate:   3.4 - 5.4 ng/mL        Normal:              > 5.4 ng/mL Performed at [facility]   Iron and TIBC     Status: Abnormal   Collection Time: 01/17/15  8:49 PM  Result Value Ref Range   Iron <10 (L) 42 - 145 ug/dL    Comment: Result repeated and verified.   TIBC Not calculated due to Iron <10. 250 - 470 ug/dL   Saturation Ratios Not calculated due to Iron <10. 20 - 55 %   UIBC 359 125 - 400 ug/dL    Comment: Performed at [facility]  Ferritin     Status: Abnormal   Collection Time: 01/17/15  8:49 PM  Result Value Ref Range   Ferritin 9 (L) 10 - 291 ng/mL    Comment: Performed at [facility]  Reticulocytes     Status: Abnormal   Collection Time:  01/17/15  8:49 PM  Result Value Ref Range   Retic Ct Pct 2.0 0.4 - 3.1 %   RBC. 2.97 (L) 3.87 - 5.11 MIL/uL   Retic Count, Manual 59.4 19.0 - 186.0 K/uL  Glucose, capillary     Status: Abnormal   Collection Time: 01/17/15  8:54 PM  Result Value Ref Range   Glucose-Capillary 137 (H) 70 - 99 mg/dL  Prepare RBC     Status: None   Collection Time: 01/17/15  9:09 PM  Result Value Ref Range   Order Confirmation ORDER PROCESSED BY BLOOD BANK   Prepare RBC     Status: None   Collection Time: 01/17/15 11:30 PM  Result Value Ref Range   Order Confirmation ORDER PROCESSED BY BLOOD BANK   Glucose, capillary     Status: Abnormal   Collection Time: 01/17/15 11:53 PM  Result Value Ref  Range   Glucose-Capillary 35 (LL) 70 - 99 mg/dL   Comment 1 Notify RN   Glucose, capillary     Status: Abnormal   Collection Time: 01/18/15 12:13 AM  Result Value Ref Range   Glucose-Capillary 174 (H) 70 - 99 mg/dL   Comment 1 Notify RN   Glucose, capillary     Status: Abnormal   Collection Time: 01/18/15  3:27 AM  Result Value Ref Range   Glucose-Capillary 45 (L) 70 - 99 mg/dL   Comment 1 Notify RN   Glucose, capillary     Status: Abnormal   Collection Time: 01/18/15  3:57 AM  Result Value Ref Range   Glucose-Capillary 179 (H) 70 - 99 mg/dL   Comment 1 Notify RN   Glucose, capillary     Status: Abnormal   Collection Time: 01/18/15  7:29 AM  Result Value Ref Range   Glucose-Capillary 37 (LL) 70 - 99 mg/dL   Comment 1 Documented in Char   Glucose, capillary     Status: Abnormal   Collection Time: 01/18/15  7:33 AM  Result Value Ref Range   Glucose-Capillary 34 (LL) 70 - 99 mg/dL   Comment 1 Notify RN   Glucose, capillary     Status: Abnormal   Collection Time: 01/18/15  7:51 AM  Result Value Ref Range   Glucose-Capillary 67 (L) 70 - 99 mg/dL   Comment 1 Documented in Char   Glucose, capillary     Status: Abnormal   Collection Time: 01/18/15  8:17 AM  Result Value Ref Range   Glucose-Capillary 63  (L) 70 - 99 mg/dL  Glucose, capillary     Status: None   Collection Time: 01/18/15  8:45 AM  Result Value Ref Range   Glucose-Capillary 75 70 - 99 mg/dL   Comment 1 Notify RN   Comprehensive metabolic panel     Status: Abnormal   Collection Time: 01/18/15  9:52 AM  Result Value Ref Range   Sodium 133 (L) 135 - 145 mmol/L   Potassium 3.8 3.5 - 5.1 mmol/L   Chloride 100 96 - 112 mmol/L   CO2 25 19 - 32 mmol/L   Glucose, Bld 59 (L) 70 - 99 mg/dL   BUN <5 (L) 6 - 23 mg/dL   Creatinine, Ser 0.81 0.50 - 1.10 mg/dL   Calcium 8.8 8.4 - 10.5 mg/dL   Total Protein 7.3 6.0 - 8.3 g/dL   Albumin 3.3 (L) 3.5 - 5.2 g/dL   AST 73 (H) 0 - 37 U/L   ALT 46 (H) 0 - 35 U/L   Alkaline Phosphatase 105 39 - 117 U/L   Total Bilirubin 1.8 (H) 0.3 - 1.2 mg/dL   GFR calc non Af Amer 81 (L) >90 mL/min   GFR calc Af Amer >90 >90 mL/min    Comment: (NOTE) The eGFR has been calculated using the CKD EPI equation. This calculation has not been validated in all clinical situations. eGFR's persistently <90 mL/min signify possible Chronic Kidney Disease.    Anion gap 8 5 - 15  Glucose, capillary     Status: None   Collection Time: 01/18/15 11:49 AM  Result Value Ref Range   Glucose-Capillary 85 70 - 99 mg/dL    Dg Chest Port 1 View  01/18/2015   CLINICAL DATA:  Dyspnea  EXAM: PORTABLE CHEST - 1 VIEW  COMPARISON:  08/31/2013  FINDINGS: A single AP portable view of the chest demonstrates no focal airspace consolidation or alveolar edema. The  lungs are grossly clear. There is no large effusion or pneumothorax. There is unchanged cardiomegaly. Cardiac and mediastinal contours are otherwise unremarkable.  IMPRESSION: Unchanged cardiomegaly.  No acute findings are evident.   Electronically Signed   By: Andreas Newport M.D.   On: 01/18/2015 00:12               Blood pressure 107/66, pulse 92, temperature 99.7 F (37.6 C), temperature source Oral, resp. rate 20, height $RemoveBe'5\' 4"'[id number]$  (1.626 m), weight 107.276 kg  (236 lb 8 oz), SpO2 100 %.  Physical exam:   General--obese African-American female in no acute distress ENT-- nonicteric Neck-- obese neck Heart-- regular rate and rhythm without murmurs are gallops Lungs--clear Abdomen-- obese but soft and nontender Psych--   Assessment: 1. Anemia. Hemoglobin 6.4 has received transfusion. She has a family history of colon cancer and colon polyps and definitely needs colonoscopy particularly in view of her recent onset of bright blood around her bowel movements. She also has chronic reflux symptoms and I think should have EGD as well. 2. GERD 3. Elevated liver test. This is minimal and probably related to alcohol she may well have fatty liver 4. Weight loss probably due to depression 5. Depression with suicidal ideation. This appears to be related to her sisters recent death. The patient is also drinking fairly heavily. She may need psychiatric evaluation. 6. Family history of colon cancer and colon polyps  Plan: 1. I think she should have both EGD and colonoscopy. Who will try to get these procedures scheduled on Monday. I would recommend that they be done with propofol sedation due to her obesity. 2. Empiric PPI therapy   Rolin Schult JR,Tayden Nichelson L 01/18/2015, 6:28 PM

## 2015-01-18 NOTE — Progress Notes (Signed)
UR completed 

## 2015-01-19 DIAGNOSIS — I5189 Other ill-defined heart diseases: Secondary | ICD-10-CM | POA: Diagnosis present

## 2015-01-19 DIAGNOSIS — D649 Anemia, unspecified: Secondary | ICD-10-CM | POA: Diagnosis present

## 2015-01-19 DIAGNOSIS — I509 Heart failure, unspecified: Secondary | ICD-10-CM

## 2015-01-19 LAB — GLUCOSE, CAPILLARY
GLUCOSE-CAPILLARY: 125 mg/dL — AB (ref 70–99)
GLUCOSE-CAPILLARY: 127 mg/dL — AB (ref 70–99)
Glucose-Capillary: 109 mg/dL — ABNORMAL HIGH (ref 70–99)
Glucose-Capillary: 133 mg/dL — ABNORMAL HIGH (ref 70–99)
Glucose-Capillary: 137 mg/dL — ABNORMAL HIGH (ref 70–99)
Glucose-Capillary: 91 mg/dL (ref 70–99)

## 2015-01-19 LAB — TYPE AND SCREEN
ABO/RH(D): O NEG
Antibody Screen: NEGATIVE
Unit division: 0
Unit division: 0

## 2015-01-19 MED ORDER — POTASSIUM CHLORIDE 10 MEQ/100ML IV SOLN
10.0000 meq | INTRAVENOUS | Status: AC
  Administered 2015-01-19 (×2): 10 meq via INTRAVENOUS
  Filled 2015-01-19 (×2): qty 100

## 2015-01-19 MED ORDER — PERFLUTREN LIPID MICROSPHERE
1.0000 mL | INTRAVENOUS | Status: AC | PRN
  Administered 2015-01-19: 2 mL via INTRAVENOUS
  Filled 2015-01-19: qty 10

## 2015-01-19 MED ORDER — SODIUM CHLORIDE 0.9 % IV SOLN: INTRAVENOUS | Status: DC

## 2015-01-19 MED ORDER — POTASSIUM CHLORIDE 10 MEQ/100ML IV SOLN
10.0000 meq | INTRAVENOUS | Status: AC
  Administered 2015-01-19 (×2): 10 meq via INTRAVENOUS
  Filled 2015-01-19 (×4): qty 100

## 2015-01-19 NOTE — Progress Notes (Signed)
[facility] PROGRESS NOTE  Patricia Mejia ZHY:[id number] DOB: [birth year] DOA: 01/17/2015 PCP: No primary care provider on file.  Assessment/Plan:  GI bleed/Symptomatic anemia/acute blood loss anemia; -Most likely multifactorial to include hemorrhoids, substance abuse, and patient never having screening colonoscopy. --2/19 transfused 2 units of PRBCs  --Anemia panel ordered in the ED --GI consult; patient to receive colonoscopy and EGD in the a.m.  --IV protonix 40 mg BID --Stool heme occult pending  Abnormal LFTs and hypoalbuminemia --Undoubtedly related to her EtOH history. Cessation counseling provided. Outpatient surveillance needed.  Diastolic dysfunction -Echocardiogram; grade 1 diastolic dysfunction -Strict in and out since admission; -862 mL   -Daily a.m. weight admission weight= 107.2 kg,       2/21 weight= 108.4 kg  Hyponatremia -likely hypovolemic due to decreased PO intake -D5 0.9% saline at 75 ml/hr   Hypoglycemia -See hyponatremia  Hypokalemia  -Potassium goal> 4 -Potassium IV 10 mEq 4  Alcoholism -Placed on CIWA  Protocol -Echocardiogram. Grade 1 diastolic dysfunction   Code Status: Full Family Communication: None Disposition Plan: Resolution GI bleed   Consultants: Dr. Tresea Mall  (GI)    Procedures: 2/21 echocardiogram;-- LVEF=55%-60%.- (grade 1 diastolic dysfunction).    Cultures NA  Antibiotics: NA   DVT prophylaxis SCD   HPI/Subjective: Patricia Mejia is a 54 y.o. BF PMHx HTN, chronic diastolic CHF, morbid obesity, alcoholism (40oz malt liquor, 1-2 daily), who presented to the ED at [facility] on 01/17/2015, accompanied by her significant other. She has a history of chronic diastolic heart failure, HTN, and active tobacco and EtOH use. She reports chronic, intermittent SOB, which has worsened over the past 1-2 weeks. She has DOE and says that she would not even think about taking stairs. She reports increased  fatigue and sweatiness over the past week. She has been light-headed, but she has not passed out. She denies chest pain. No abdominal pain. She reports at least two episodes of "bloody stool" over the past couple of months, but she denies melena. She denies any abnormal vaginal bleeding. She has never had a colonoscopy.  She reports being told that she was anemic in the past, and she was never compliant with iron supplementation as recommended. Ironically, Hgb in this system one year ago was normal. She reports a 9lb weight loss and decreased appetite over the past two months. 2/20 A/O 4, states noticed blood in her stool approximately 6 months ago however thought it was from her hemorrhoids therefore did not pursue. States has never seen a gastroenterologist. States believes her dry weight~150 pounds. 2/21 A/O 4, negative abdominal pain, negative N/V, negative melena, negative bright red blood per rectum. States understands the procedures to be performed in the a.m.   Objective: Filed Vitals:   01/19/15 1031 01/19/15 1036 01/19/15 1207 01/19/15 1813  BP:  129/93 118/76 131/74  Pulse:   78 80  Temp:   98.7 F (37.1 C) 98 F (36.7 C)  TempSrc:   Oral Oral  Resp:   20 20  Height:      Weight: 108.466 kg (239 lb 2 oz)     SpO2:   100% 100%    Intake/Output Summary (Last 24 hours) at 01/19/15 2002 Last data filed at 01/19/15 1929  Gross per 24 hour  Intake   2365 ml  Output    454 ml  Net   1911 ml   Filed Weights   01/17/15 2246 01/19/15 1031  Weight: 107.276 kg (236 lb 8 oz)  108.466 kg (239 lb 2 oz)     Exam: General: A/O 4, NAD, No acute respiratory distress Lungs: Clear to auscultation bilaterally without wheezes or crackles Cardiovascular: Regular rate and rhythm without murmur gallop or rub normal S1 and S2 Abdomen: Morbidly obese, Nontender, soft, bowel sounds positive, no rebound, no ascites, no appreciable mass Extremities: No significant cyanosis, clubbing, or  edema bilateral lower extremities   Data Reviewed: Basic Metabolic Panel:  Recent Labs Lab 01/17/15 1908 01/18/15 0952 01/18/15 2031  NA 131* 133* 132*  K 3.6 3.8 3.2*  CL 100 100 99  CO2 GLUCOSE 41* 59* 122*  BUN <5* <5* 6  CREATININE 0.70 0.81 1.08  CALCIUM 9.2 8.8 8.6  MG  --   --  2.1   Liver Function Tests:  Recent Labs Lab 01/17/15 1908 01/18/15 0952 01/18/15 2031  AST 71* 73* 36  ALT 48* 46* 37*  ALKPHOS 100 105 93  BILITOT 0.4 1.8* 0.7  PROT 7.7 7.3 7.0  ALBUMIN 3.4* 3.3* 3.0*   No results for input(s): LIPASE, AMYLASE in the last 168 hours. No results for input(s): AMMONIA in the last 168 hours. CBC:  Recent Labs Lab 01/17/15 1908 01/18/15 2031  WBC 5.8 5.9  NEUTROABS 4.0 3.5  HGB 6.4* 8.3*  HCT 22.5* 26.8*  MCV 70.5* 73.6*  PLT 166 149*   Cardiac Enzymes: No results for input(s): CKTOTAL, CKMB, CKMBINDEX, TROPONINI in the last 168 hours. BNP (last 3 results) No results for input(s): BNP in the last 8760 hours.  ProBNP (last 3 results) No results for input(s): PROBNP in the last 8760 hours.  CBG:  Recent Labs Lab 01/19/15 0011 01/19/15 0416 01/19/15 0804 01/19/15 1202 01/19/15 1617  GLUCAP 137* 125* 133* 127* 91    No results found for this or any previous visit (from the past 240 hour(s)).   Studies: Dg Chest Port 1 View  01/18/2015   CLINICAL DATA:  Dyspnea  EXAM: PORTABLE CHEST - 1 VIEW  COMPARISON:  08/31/2013  FINDINGS: A single AP portable view of the chest demonstrates no focal airspace consolidation or alveolar edema. The lungs are grossly clear. There is no large effusion or pneumothorax. There is unchanged cardiomegaly. Cardiac and mediastinal contours are otherwise unremarkable.  IMPRESSION: Unchanged cardiomegaly.  No acute findings are evident.   Electronically Signed   By: Ellery Plunk M.D.   On: 01/18/2015 00:12    Scheduled Meds: . carvedilol  12.5 mg Oral BID WC  . folic acid  1 mg Oral Daily  .  furosemide  20 mg Intravenous Once  . multivitamin with minerals  1 tablet Oral Daily  . nicotine  14 mg Transdermal Daily  . pantoprazole (PROTONIX) IV  40 mg Intravenous Q12H  . potassium chloride  10 mEq Intravenous Q1 Hr x 2  . thiamine  100 mg Oral Daily   Or  . thiamine  100 mg Intravenous Daily   Continuous Infusions: . sodium chloride    . dextrose 5 % and 0.9% NaCl 75 mL/hr at 01/19/15 1730    Active Problems:   Anemia   Symptomatic anemia   Acute blood loss anemia   Abnormal LFTs   Hypoalbuminemia   Congestive heart disease   Hyponatremia   Hypoglycemia   Alcoholism   Absolute anemia   Diastolic dysfunction    Time spent: 40 min    WOODS, CURTIS, J  [facility] Pager [phone]. If 7PM-7AM, please contact night-coverage at [email or web address], password  [email or web address] 01/19/2015, 8:02 PM     Care during the described time interval was provided by me .  I have reviewed this patient's available data, including medical history, events of note, physical examination, radiology studies and test results as part of my evaluation  Carolyne Littlesurtis Woods, MD [phone] Pager

## 2015-01-19 NOTE — Anesthesia Preprocedure Evaluation (Addendum)
Anesthesia Evaluation  Patient identified by MRN, date of birth, ID band Patient awake    Reviewed: Allergy & Precautions, NPO status , Patient's Chart, lab work & pertinent test results, reviewed documented beta blocker date and time   Airway Mallampati: III  TM Distance: >3 FB   Mouth opening: Limited Mouth Opening  Dental  (+) Dental Advisory Given, Teeth Intact   Pulmonary Current Smoker (14 pack year hx),  breath sounds clear to auscultation        Cardiovascular hypertension, Pt. on medications and Pt. on home beta blockers + Peripheral Vascular Disease and +CHF  ECHO 12/2014 55%   Neuro/Psych Anxiety Bipolar Disorder negative neurological ROS     GI/Hepatic Neg liver ROS, Bowel prep,(+)     substance abuse  alcohol use, BLEED   Endo/Other    Renal/GU negative Renal ROS     Musculoskeletal negative musculoskeletal ROS (+)   Abdominal (+)  Abdomen: soft. Bowel sounds: normal.  Peds  Hematology  (+) anemia , 8.3/27   Anesthesia Other Findings   Reproductive/Obstetrics                           Anesthesia Physical Anesthesia Plan  ASA: III  Anesthesia Plan: MAC   Post-op Pain Management:    Induction:   Airway Management Planned: Nasal Cannula  Additional Equipment:   Intra-op Plan:   Post-operative Plan:   Informed Consent: I have reviewed the patients History and Physical, chart, labs and discussed the procedure including the risks, benefits and alternatives for the proposed anesthesia with the patient or authorized representative who has indicated his/her understanding and acceptance.     Plan Discussed with:   Anesthesia Plan Comments:         Anesthesia Quick Evaluation

## 2015-01-19 NOTE — Progress Notes (Signed)
  Echocardiogram 2D Echocardiogram has been performed.  Leta JunglingCooper, Danaysia Rader M 01/19/2015, 10:39 AM

## 2015-01-19 NOTE — Progress Notes (Signed)
[facility] PROGRESS NOTE Subjective patient without any events during the night with no gross bleeding  Objective: Vital signs in last 24 hours: Temp:  [98.4 F (36.9 C)-99.7 F (37.6 C)] 98.7 F (37.1 C) (02/21 0551) Pulse Rate:  [78-92] 78 (02/21 1019) Resp:  [16-20] 16 (02/21 0551) BP: (98-129)/(56-82) 129/82 mmHg (02/21 1019) SpO2:  [100 %] 100 % (02/21 1019) Last BM Date: 01/17/15  Intake/Output from previous day: 02/20 0701 - 02/21 0700 In: 2226.3 [P.O.:1550; I.V.:676.3] Out: 3450 [Urine:3450] Intake/Output this shift: Total I/O In: 360 [P.O.:360] Out: -   PE: General--no acute distress currently having cardiac echo - Abdomen-- nontender  Lab Results:  Recent Labs  01/17/15 1908 01/18/15 2031  WBC 5.8 5.9  HGB 6.4* 8.3*  HCT 22.5* 26.8*  PLT 166 149*   BMET  Recent Labs  01/17/15 1908 01/18/15 0952 01/18/15 2031  NA 131* 133* 132*  K 3.6 3.8 3.2*  CL 100 100 99  CO2 20 25 25   CREATININE 0.70 0.81 1.08   LFT  Recent Labs  01/17/15 1908 01/18/15 0952 01/18/15 2031  PROT 7.7 7.3 7.0  AST 71* 73* 36  ALT 48* 46* 37*  ALKPHOS 100 105 93  BILITOT 0.4 1.8* 0.7   PT/INR  Recent Labs  01/17/15 1908  LABPROT 14.6  INR 1.12   PANCREAS No results for input(s): LIPASE in the last 72 hours.       Studies/Results: Dg Chest Port 1 View  01/18/2015   CLINICAL DATA:  Dyspnea  EXAM: PORTABLE CHEST - 1 VIEW  COMPARISON:  08/31/2013  FINDINGS: A single AP portable view of the chest demonstrates no focal airspace consolidation or alveolar edema. The lungs are grossly clear. There is no large effusion or pneumothorax. There is unchanged cardiomegaly. Cardiac and mediastinal contours are otherwise unremarkable.  IMPRESSION: Unchanged cardiomegaly.  No acute findings are evident.   Electronically Signed   By: Ellery Plunkaniel R Mitchell M.D.   On: 01/18/2015 00:12    Medications: I have reviewed the patient's current  medications.  Assessment/Plan: 1. Marked anemia. Patient schedule for EGD and colonoscopy tomorrow with Dr. Dulce Sellarutlaw patient where the procedures. Orders have been written..   Traevon Meiring JR,Eliza Green L 01/19/2015, 10:21 AM

## 2015-01-19 NOTE — Progress Notes (Signed)
Utilization Review Completed.   Thijs Brunton, RN, BSN Nurse Case Manager  

## 2015-01-20 ENCOUNTER — Encounter (HOSPITAL_COMMUNITY): Payer: Self-pay | Admitting: *Deleted

## 2015-01-20 ENCOUNTER — Observation Stay (HOSPITAL_COMMUNITY): Payer: Medicaid Other | Admitting: Anesthesiology

## 2015-01-20 ENCOUNTER — Encounter (HOSPITAL_COMMUNITY)
Admission: EM | Disposition: A | Discharge status: Home / Self Care | Payer: Self-pay | Source: Home / Self Care | Attending: Internal Medicine

## 2015-01-20 DIAGNOSIS — D62 Acute posthemorrhagic anemia: Secondary | ICD-10-CM

## 2015-01-20 DIAGNOSIS — I519 Heart disease, unspecified: Secondary | ICD-10-CM | POA: Diagnosis not present

## 2015-01-20 DIAGNOSIS — E871 Hypo-osmolality and hyponatremia: Secondary | ICD-10-CM | POA: Diagnosis not present

## 2015-01-20 DIAGNOSIS — R7989 Other specified abnormal findings of blood chemistry: Secondary | ICD-10-CM

## 2015-01-20 DIAGNOSIS — E162 Hypoglycemia, unspecified: Secondary | ICD-10-CM | POA: Diagnosis not present

## 2015-01-20 HISTORY — PX: ESOPHAGOGASTRODUODENOSCOPY: SHX5428

## 2015-01-20 HISTORY — PX: COLONOSCOPY: SHX5424

## 2015-01-20 LAB — HEMOGLOBIN A1C
Hgb A1c MFr Bld: 5.5 % (ref 4.8–5.6)
Mean Plasma Glucose: 111 mg/dL

## 2015-01-20 LAB — CBC
HEMATOCRIT: 25.7 % — AB (ref 36.0–46.0)
HEMOGLOBIN: 7.9 g/dL — AB (ref 12.0–15.0)
MCH: 22.6 pg — AB (ref 26.0–34.0)
MCHC: 30.7 g/dL (ref 30.0–36.0)
MCV: 73.4 fL — ABNORMAL LOW (ref 78.0–100.0)
Platelets: 132 10*3/uL — ABNORMAL LOW (ref 150–400)
RBC: 3.5 MIL/uL — ABNORMAL LOW (ref 3.87–5.11)
RDW: 19.9 % — ABNORMAL HIGH (ref 11.5–15.5)
WBC: 7 10*3/uL (ref 4.0–10.5)

## 2015-01-20 LAB — OCCULT BLOOD, POC DEVICE: Fecal Occult Bld: POSITIVE — AB

## 2015-01-20 LAB — GLUCOSE, CAPILLARY
GLUCOSE-CAPILLARY: 37 mg/dL — AB (ref 70–99)
GLUCOSE-CAPILLARY: 141 mg/dL — AB (ref 70–99)
GLUCOSE-CAPILLARY: 92 mg/dL (ref 70–99)
Glucose-Capillary: 118 mg/dL — ABNORMAL HIGH (ref 70–99)
Glucose-Capillary: 119 mg/dL — ABNORMAL HIGH (ref 70–99)
Glucose-Capillary: 128 mg/dL — ABNORMAL HIGH (ref 70–99)
Glucose-Capillary: 94 mg/dL (ref 70–99)

## 2015-01-20 SURGERY — EGD (ESOPHAGOGASTRODUODENOSCOPY)
Anesthesia: Monitor Anesthesia Care

## 2015-01-20 MED ORDER — PANTOPRAZOLE SODIUM 40 MG PO TBEC
40.0000 mg | DELAYED_RELEASE_TABLET | Freq: Every day | ORAL | Status: DC
  Administered 2015-01-21: 40 mg via ORAL
  Filled 2015-01-20: qty 1

## 2015-01-20 MED ORDER — LACTATED RINGERS IV SOLN
INTRAVENOUS | Status: DC
  Administered 2015-01-20: 1000 mL via INTRAVENOUS

## 2015-01-20 MED ORDER — PROPOFOL INFUSION 10 MG/ML OPTIME
INTRAVENOUS | Status: DC | PRN
  Administered 2015-01-20: 100 ug/kg/min via INTRAVENOUS

## 2015-01-20 MED ORDER — POLYSACCHARIDE IRON COMPLEX 150 MG PO CAPS
150.0000 mg | ORAL_CAPSULE | Freq: Every day | ORAL | Status: DC
  Administered 2015-01-20 – 2015-01-21 (×2): 150 mg via ORAL
  Filled 2015-01-20 (×2): qty 1

## 2015-01-20 MED ORDER — SODIUM CHLORIDE 0.9 % IV SOLN
500.0000 mg | INTRAVENOUS | Status: DC
  Administered 2015-01-20: 500 mg via INTRAVENOUS
  Filled 2015-01-20 (×2): qty 10

## 2015-01-20 MED ORDER — SODIUM CHLORIDE 0.9 % IV SOLN
25.0000 mg | Freq: Once | INTRAVENOUS | Status: AC
  Administered 2015-01-20: 25 mg via INTRAVENOUS
  Filled 2015-01-20: qty 0.5

## 2015-01-20 MED ORDER — LACTATED RINGERS IV SOLN
INTRAVENOUS | Status: DC | PRN
  Administered 2015-01-20: 10:00:00 via INTRAVENOUS

## 2015-01-20 MED ORDER — MIDAZOLAM HCL 5 MG/5ML IJ SOLN
INTRAMUSCULAR | Status: DC | PRN
  Administered 2015-01-20: 2 mg via INTRAVENOUS

## 2015-01-20 MED ORDER — FENTANYL CITRATE 0.05 MG/ML IJ SOLN
INTRAMUSCULAR | Status: DC | PRN
  Administered 2015-01-20 (×3): 50 ug via INTRAVENOUS

## 2015-01-20 NOTE — Op Note (Signed)
[facility] [address] [place], [id number]   COLONOSCOPY PROCEDURE REPORT  PATIENT: Patricia CoasterMurphy, Toccara L  MR#: [id number] BIRTHDATE: 1961-05-21 , 53  yrs. old GENDER: female ENDOSCOPIST: Willis ModenaWilliam Handsome Anglin, MD REFERRED BY: PROCEDURE DATE:  01/20/2015 PROCEDURE:   Colonoscopy with biopsy and Colonoscopy with snare polypectomy ASA CLASS:   Class III INDICATIONS:hematochezia, anemia. MEDICATIONS: Per Anesthesia  DESCRIPTION OF PROCEDURE:   After the risks benefits and alternatives of the procedure were thoroughly explained to patient. Informed consent was obtained.  Digital rectal exam revealed external hemorrhoids.   The adult  colonoscope was introduced through the anus and advanced to the cecum, which was identified by both the appendix and ileocecal valve. No adverse events experienced.   The quality of the prep was adequate  The instrument was then slowly withdrawn as the colon was fully examined.    Findings:  External hemorrhoids, otherwise normal digital rectal exam.  Prep quality was adequate.  8mm cecal polyp and 6mm sigmoid polyp were removed with hot snare.  3mm rectal polyp was removed with cold biopsy forceps.  No diverticula evident.  There was some shallow, well-defined ulceration about 2 x 1 cm in diameter, along the ileocecal valve; biopsies were obtained.  No other polyps, masses, vascular ectasias, or inflammatory changes were seen. Retroflexed view of rectum revealed internal hemorrhoids. Withdrawal time was over 10 minutes     .  The scope was withdrawn and the procedure completed.  COMPLICATIONS: None.  ENDOSCOPIC IMPRESSION:     As above.  Suspect anemia might be due to upper GI tract angioectasias.  Unclear etiology or significance of ileocecal valve ulceration, possibly ischemic.  Hemorrhoids likely cause of her overt hematochezia.  Polyps likely incidental and not causing any other her above symptoms.  RECOMMENDATIONS:      1.  Watch for potential complications of procedure. 2.  Await biopsy results. 3.  Advance diet as tolerated. 4.  Topical hemorrhoidal therapy as needed. 5.  If anemia persists as outpatient despite PPI and oral iron therapy, consider capsule endoscopy prior to consideration of any endoscopic ablative therapy in her stomach. 6.  [facility] will follow.  eSigned:  Willis ModenaWilliam Latessa Tillis, MD 01/20/2015 11:18 AM   cc:  CPT CODES: ICD CODES:  The ICD and CPT codes recommended by this software are interpretations from the data that the clinical staff has captured with the software.  The verification of the translation of this report to the ICD and CPT codes and modifiers is the sole responsibility of the health care institution and practicing physician where this report was generated.  [employer] will not be held responsible for the validity of the ICD and CPT codes included on this report.  AMA assumes no liability for data contained or not contained herein. CPT is a Publishing rights managerregistered trademark of the [employer].

## 2015-01-20 NOTE — Progress Notes (Signed)
PATIENT DETAILS Name: Patricia CoasterBrenda L Lawson Age: 54 y.o. Sex: female Date of Birth: 12/07/1960 Admit Date: 01/17/2015 Admitting Physician Jerene BearsNikki Harrison Carter, MD PCP:No primary care provider on file.  Subjective: Mild abd pain today, but otherwise no complaints  Assessment/Plan: Active Problems:    ?Subacute Blood Loss Anemia: Suspected slow GI loss, as no overt bleeding evident while inpatient, she does have a history of intermittent hematochezia.. Given 2 units of PRBC on admission, hemoglobin today is stable at 7.9. Will start IV iron while inpatient. Will need to discharge home on oral iron. Await EGD/colonoscopy results. Follow hemoglobin.     Suspected lower GI bleed: Given history of hematochezia and severe microcytic/iron deficiency anemia-GI consulted. No overt bleeding noted during hospitalization. Await EGD/colonoscopy results.     Hypoglycemia:? Etiology. Suspect may have been related to poor oral intake. Once diet is resumed and patient is back from endoscopy/colonoscopy, will stop IV fluids, encourage oral intake and monitor CBGs. Check A1c.     Chronic diastolic heart failure: Clinically compensated. Once diet is advanced, will stop all IV fluids.     Mild hyponatremia/hypokalemia: Recheck electrolytes in a.m.     History of EtOH abuse: No signs of withdrawal. On Ativan per CIWA protocol.     Mildly elevated liver enzymes: Likely secondary to alcohol, downtrending. Monitor periodically     Tobacco abuse: Continue with transdermal nicotine  Disposition: Remain inpatient  Antibiotics:  None   Anti-infectives    None      DVT Prophylaxis: SCD's  Code Status: Full code  Family Communication None at bedside  Procedures:  None  CONSULTS:  GI   MEDICATIONS: Scheduled Meds: . carvedilol  12.5 mg Oral BID WC  . folic acid  1 mg Oral Daily  . furosemide  20 mg Intravenous Once  . multivitamin with minerals  1 tablet Oral Daily  . nicotine  14  mg Transdermal Daily  . pantoprazole (PROTONIX) IV  40 mg Intravenous Q12H  . thiamine  100 mg Oral Daily   Or  . thiamine  100 mg Intravenous Daily   Continuous Infusions: . sodium chloride    . dextrose 5 % and 0.9% NaCl 75 mL/hr at 01/19/15 1730  . lactated ringers 1,000 mL (01/20/15 0914)   PRN Meds:.acetaminophen **OR** acetaminophen, albuterol, Influenza vac split quadrivalent PF, LORazepam **OR** LORazepam, ondansetron **OR** ondansetron (ZOFRAN) IV, pneumococcal 23 valent vaccine    PHYSICAL EXAM: Vital signs in last 24 hours: Filed Vitals:   01/19/15 1813 01/19/15 2144 01/20/15 0414 01/20/15 0906  BP: 131/74 123/68 119/63 183/93  Pulse: 80 78 80   Temp: 98 F (36.7 C) 98.3 F (36.8 C) 98.8 F (37.1 C) 98 F (36.7 C)  TempSrc: Oral Oral Oral Oral  Resp: 20 18 20 18   Height:      Weight:   109.59 kg (241 lb 9.6 oz)   SpO2: 100% 100% 95% 100%    Weight change:  Filed Weights   01/17/15 2246 01/19/15 1031 01/20/15 0414  Weight: 107.276 kg (236 lb 8 oz) 108.466 kg (239 lb 2 oz) 109.59 kg (241 lb 9.6 oz)   Body mass index is 41.45 kg/(m^2).   Gen Exam: Awake and alert with clear speech.   Neck: Supple, No JVD.   Chest: B/L Clear.  CVS: S1 S2 Regular, no murmurs.  Abdomen: soft, BS +, non tender, non distended.  Extremities: no edema, lower extremities warm to touch. Neurologic: Non Focal.  Skin: No Rash.   Wounds: N/A.    Intake/Output from previous day:  Intake/Output Summary (Last 24 hours) at 01/20/15 1117 Last data filed at 01/20/15 1108  Gross per 24 hour  Intake 2633.75 ml  Output    554 ml  Net 2079.75 ml     LAB RESULTS: CBC  Recent Labs Lab 01/17/15 1908 01/18/15 2031 01/20/15 0833  WBC 5.8 5.9 7.0  HGB 6.4* 8.3* 7.9*  HCT 22.5* 26.8* 25.7*  PLT 166 149* 132*  MCV 70.5* 73.6* 73.4*  MCH 20.1* 22.8* 22.6*  MCHC 28.4* 31.0 30.7  RDW 18.2* 18.9* 19.9*  LYMPHSABS 1.2 1.9  --   MONOABS 0.5 0.5  --   EOSABS 0.1 0.0  --   BASOSABS  0.0 0.0  --     Chemistries   Recent Labs Lab 01/17/15 1908 01/18/15 0952 01/18/15 2031  NA 131* 133* 132*  K 3.6 3.8 3.2*  CL 100 100 99  CO2 GLUCOSE 41* 59* 122*  BUN <5* <5* 6  CREATININE 0.70 0.81 1.08  CALCIUM 9.2 8.8 8.6  MG  --   --  2.1    CBG:  Recent Labs Lab 01/19/15 1617 01/19/15 2051 01/20/15 0018 01/20/15 0412 01/20/15 0804  GLUCAP 91 109* 118* 128* 141*    GFR Estimated Creatinine Clearance: 72.9 mL/min (by C-G formula based on Cr of 1.08).  Coagulation profile  Recent Labs Lab 01/17/15 1908  INR 1.12    Cardiac Enzymes No results for input(s): CKMB, TROPONINI, MYOGLOBIN in the last 168 hours.  Invalid input(s): CK  Invalid input(s): POCBNP No results for input(s): DDIMER in the last 72 hours.  Recent Labs  01/18/15 2031  HGBA1C 5.5    Recent Labs  01/18/15 2031  CHOL 117  HDL 57  LDLCALC 47  TRIG 64  CHOLHDL 2.1   No results for input(s): TSH, T4TOTAL, T3FREE, THYROIDAB in the last 72 hours.  Invalid input(s): FREET3  Recent Labs  01/17/15 2049  VITAMINB12 501  FOLATE 6.2  FERRITIN 9*  TIBC Not calculated due to Iron <10.  IRON <10*  RETICCTPCT 2.0   No results for input(s): LIPASE, AMYLASE in the last 72 hours.  Urine Studies No results for input(s): UHGB, CRYS in the last 72 hours.  Invalid input(s): UACOL, UAPR, USPG, UPH, UTP, UGL, UKET, UBIL, UNIT, UROB, ULEU, UEPI, UWBC, URBC, UBAC, CAST, UCOM, BILUA  MICROBIOLOGY: No results found for this or any previous visit (from the past 240 hour(s)).  RADIOLOGY STUDIES/RESULTS: Dg Chest Port 1 View  01/18/2015   CLINICAL DATA:  Dyspnea  EXAM: PORTABLE CHEST - 1 VIEW  COMPARISON:  08/31/2013  FINDINGS: A single AP portable view of the chest demonstrates no focal airspace consolidation or alveolar edema. The lungs are grossly clear. There is no large effusion or pneumothorax. There is unchanged cardiomegaly. Cardiac and mediastinal contours are otherwise  unremarkable.  IMPRESSION: Unchanged cardiomegaly.  No acute findings are evident.   Electronically Signed   By: Ellery Plunk M.D.   On: 01/18/2015 00:12    Jeoffrey Massed, MD  [facility] Pager:[phone]  If 7PM-7AM, please contact night-coverage [email or web address] Password [facility] 01/20/2015, 11:17 AM

## 2015-01-20 NOTE — Anesthesia Postprocedure Evaluation (Signed)
  Anesthesia Post-op Note  Patient: Patricia Mejia  Procedure(s) Performed: Procedure(s): ESOPHAGOGASTRODUODENOSCOPY (EGD) (N/A) COLONOSCOPY (N/A)  Patient Location: PACU and Endoscopy Unit  Anesthesia Type:MAC  Level of Consciousness: awake and oriented  Airway and Oxygen Therapy: Patient Spontanous Breathing and Patient connected to nasal cannula oxygen  Post-op Pain: none  Post-op Assessment: Post-op Vital signs reviewed, Patient's Cardiovascular Status Stable and Respiratory Function Stable  Post-op Vital Signs: Reviewed and stable  Last Vitals:  Filed Vitals:   01/20/15 1119  BP:   Pulse:   Temp:   Resp: 20    Complications: No apparent anesthesia complications

## 2015-01-20 NOTE — H&P (View-Only) (Signed)
[facility] PROGRESS NOTE Subjective patient without any events during the night with no gross bleeding  Objective: Vital signs in last 24 hours: Temp:  [98.4 F (36.9 C)-99.7 F (37.6 C)] 98.7 F (37.1 C) (02/21 0551) Pulse Rate:  [78-92] 78 (02/21 1019) Resp:  [16-20] 16 (02/21 0551) BP: (98-129)/(56-82) 129/82 mmHg (02/21 1019) SpO2:  [100 %] 100 % (02/21 1019) Last BM Date: 01/17/15  Intake/Output from previous day: 02/20 0701 - 02/21 0700 In: 2226.3 [P.O.:1550; I.V.:676.3] Out: 3450 [Urine:3450] Intake/Output this shift: Total I/O In: 360 [P.O.:360] Out: -   PE: General--no acute distress currently having cardiac echo - Abdomen-- nontender  Lab Results:  Recent Labs  01/17/15 1908 01/18/15 2031  WBC 5.8 5.9  HGB 6.4* 8.3*  HCT 22.5* 26.8*  PLT 166 149*   BMET  Recent Labs  01/17/15 1908 01/18/15 0952 01/18/15 2031  NA 131* 133* 132*  K 3.6 3.8 3.2*  CL 100 100 99  CO2 20 25 25  CREATININE 0.70 0.81 1.08   LFT  Recent Labs  01/17/15 1908 01/18/15 0952 01/18/15 2031  PROT 7.7 7.3 7.0  AST 71* 73* 36  ALT 48* 46* 37*  ALKPHOS 100 105 93  BILITOT 0.4 1.8* 0.7   PT/INR  Recent Labs  01/17/15 1908  LABPROT 14.6  INR 1.12   PANCREAS No results for input(s): LIPASE in the last 72 hours.       Studies/Results: Dg Chest Port 1 View  01/18/2015   CLINICAL DATA:  Dyspnea  EXAM: PORTABLE CHEST - 1 VIEW  COMPARISON:  08/31/2013  FINDINGS: A single AP portable view of the chest demonstrates no focal airspace consolidation or alveolar edema. The lungs are grossly clear. There is no large effusion or pneumothorax. There is unchanged cardiomegaly. Cardiac and mediastinal contours are otherwise unremarkable.  IMPRESSION: Unchanged cardiomegaly.  No acute findings are evident.   Electronically Signed   By: Daniel R Mitchell M.D.   On: 01/18/2015 00:12    Medications: I have reviewed the patient's current  medications.  Assessment/Plan: 1. Marked anemia. Patient schedule for EGD and colonoscopy tomorrow with Dr. Outlaw patient where the procedures. Orders have been written..   Marveline Profeta JR,Greyson Riccardi L 01/19/2015, 10:21 AM   

## 2015-01-20 NOTE — Interval H&P Note (Signed)
History and Physical Interval Note:  01/20/2015 10:16 AM  Patricia Mejia  has presented today for surgery, with the diagnosis of anemia GI bleed  The various methods of treatment have been discussed with the patient and family. After consideration of risks, benefits and other options for treatment, the patient has consented to  Procedure(s): ESOPHAGOGASTRODUODENOSCOPY (EGD) (N/A) COLONOSCOPY (N/A) as a surgical intervention .  The patient's history has been reviewed, patient examined, no change in status, stable for surgery.  I have reviewed the patient's chart and labs.  Questions were answered to the patient's satisfaction.     Amaro Mangold M  Assessment:  1.  Microcytic anemia. 2.  Intermittent hematochezia.  Plan:   1.  Endoscopy. 2.  Risks (bleeding, infection, bowel perforation that could require surgery, sedation-related changes in cardiopulmonary systems), benefits (identification and possible treatment of source of symptoms, exclusion of certain causes of symptoms), and alternatives (watchful waiting, radiographic imaging studies, empiric medical treatment) of upper endoscopy (EGD) were explained to patient/family in detail and patient wishes to proceed. 3.  Colonoscopy. 4.  Risks (bleeding, infection, bowel perforation that could require surgery, sedation-related changes in cardiopulmonary systems), benefits (identification and possible treatment of source of symptoms, exclusion of certain causes of symptoms), and alternatives (watchful waiting, radiographic imaging studies, empiric medical treatment) of colonoscopy were explained to patient/family in detail and patient wishes to proceed.

## 2015-01-20 NOTE — Progress Notes (Signed)
UR completed 

## 2015-01-20 NOTE — Transfer of Care (Signed)
Immediate Anesthesia Transfer of Care Note  Patient: Patricia Mejia  Procedure(s) Performed: Procedure(s): ESOPHAGOGASTRODUODENOSCOPY (EGD) (N/A) COLONOSCOPY (N/A)  Patient Location: Endoscopy Unit  Anesthesia Type:MAC  Level of Consciousness: awake, alert  and sedated  Airway & Oxygen Therapy: Patient connected to nasal cannula oxygen  Post-op Assessment: Report given to RN  Post vital signs: stable  Last Vitals:  Filed Vitals:   01/20/15 0906  BP: 183/93  Pulse:   Temp: 36.7 C  Resp: 18    Complications: No apparent anesthesia complications

## 2015-01-20 NOTE — Op Note (Signed)
[facility] [address] [place], [id number]   ENDOSCOPY PROCEDURE REPORT  PATIENT: [name], Patricia L  MR#: [id number] BIRTHDATE: [birth year] , 53  yrs. old GENDER: female ENDOSCOPIST: Willis ModenaWilliam Haru Shaff, MD REFERRED BY:  [facility] PROCEDURE DATE:  01/20/2015 PROCEDURE:  EGD, diagnostic ASA CLASS:     Class III INDICATIONS:  microcytic anemia, hematochezia. MEDICATIONS: Per Anesthesia TOPICAL ANESTHETIC: Cetacaine Spray  DESCRIPTION OF PROCEDURE: After the risks benefits and alternatives of the procedure were thoroughly explained, informed consent was obtained.  The Pentax Gastroscope [id number] endoscope was introduced through the mouth and advanced to the second portion of the duodenum. The instrument was slowly withdrawn as the mucosa was fully examined.    Findings:  Normal esophagus.  Patchy erythema in cardia and body, mild.  More significant, though still mild-to-moderate, erythema in the pre-pyloric antrum and along the pyloric channel.  Otherwise normal endoscopy to the second portion of the duodenum. The scope was then withdrawn from the patient and the procedure completed.  COMPLICATIONS: There were no immediate complications.  ENDOSCOPIC IMPRESSION:     As above.  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  RECOMMENDATIONS:     1.  Watch for potential complications of procedure. 2.  Oral iron (Nu-Iron 150 mg po qd). 3.  PPI (Pantoprazole 40 mg po qd). 4.  Proceed with colonoscopy today.  eSigned:  Willis ModenaWilliam Tamura Lasky, MD 01/20/2015 11:22 AM   CC:  CPT CODES: ICD CODES:  The ICD and CPT codes recommended by this software are interpretations from the data that the clinical staff has captured with the software.  The verification of the translation of this report to the ICD and CPT codes and modifiers is the sole responsibility of the health care institution and practicing physician where this report  was generated.  [employer] will not be held responsible for the validity of the ICD and CPT codes included on this report.  AMA assumes no liability for data contained or not contained herein. CPT is a Publishing rights managerregistered trademark of the [employer].

## 2015-01-20 NOTE — Progress Notes (Addendum)
MEDICATION RELATED CONSULT NOTE - INITIAL   Pharmacy Consult:  Infed Indication:  Anemia with low iron  Allergies  Allergen Reactions  . Benazepril Hcl     REACTION: angioedema  . Latex Itching and Rash    Patient Measurements: Height: 5\' 4"  (162.6 cm) Weight: 241 lb 9.6 oz (109.59 kg) IBW/kg (Calculated) : 54.7  Vital Signs: Temp: 97.6 F (36.4 C) (02/22 1155) Temp Source: Axillary (02/22 1155) BP: 123/76 mmHg (02/22 1155) Pulse Rate: 85 (02/22 1155) Intake/Output from previous day: 02/21 0701 - 02/22 0700 In: 2293.8 [P.O.:720; I.V.:1373.8; IV Piggyback:200] Out: 404 [Urine:400; Stool:4] Intake/Output from this shift: Total I/O In: 900 [I.V.:900] Out: 150 [Urine:150]  Labs:  Recent Labs  01/17/15 1908 01/18/15 0952 01/18/15 2031 01/20/15 0833  WBC 5.8  --  5.9 7.0  HGB 6.4*  --  8.3* 7.9*  HCT 22.5*  --  26.8* 25.7*  PLT 166  --  149* 132*  APTT 40*  --   --   --   CREATININE 0.70 0.81 1.08  --   MG  --   --  2.1  --   ALBUMIN 3.4* 3.3* 3.0*  --   PROT 7.7 7.3 7.0  --   AST 71* 73* 36  --   ALT 48* 46* 37*  --   ALKPHOS 100 105 93  --   BILITOT 0.4 1.8* 0.7  --    Estimated Creatinine Clearance: 72.9 mL/min (by C-G formula based on Cr of 1.08).   Microbiology: No results found for this or any previous visit (from the past 720 hour(s)).  Medical History: Past Medical History  Diagnosis Date  . Hypertension   . CHF (congestive heart failure)     Diagnosed in 08/01/2012      Assessment: [age] YOF with severe anemia from upper GI angioectasias and low iron level to receive IV iron supplementation with Infed.   Goal of Therapy:  Hgb > 11 g/dL   Plan:  - Iron dextran 25mg  IV x 1 test dose - If tolerated test dose, supplement with Infed 500mg  IV Q24H x 2 doses - Continue PO iron per MD   Chelsea Aushuy D. Laney Potashang, PharmD, BCPS Pager:  [phone] 01/20/2015, 1:23 PM

## 2015-01-21 ENCOUNTER — Inpatient Hospital Stay (HOSPITAL_COMMUNITY): Payer: Medicaid Other

## 2015-01-21 ENCOUNTER — Encounter (HOSPITAL_COMMUNITY): Payer: Self-pay | Admitting: Gastroenterology

## 2015-01-21 ENCOUNTER — Other Ambulatory Visit (HOSPITAL_COMMUNITY): Payer: Self-pay

## 2015-01-21 ENCOUNTER — Inpatient Hospital Stay (HOSPITAL_COMMUNITY)
Admission: EM | Admit: 2015-01-21 | Discharge: 2015-01-28 | DRG: 296 | Disposition: E | Payer: Medicaid Other | Attending: Pulmonary Disease | Admitting: Pulmonary Disease

## 2015-01-21 ENCOUNTER — Emergency Department (HOSPITAL_COMMUNITY): Payer: Medicaid Other

## 2015-01-21 DIAGNOSIS — E876 Hypokalemia: Secondary | ICD-10-CM | POA: Diagnosis present

## 2015-01-21 DIAGNOSIS — J9600 Acute respiratory failure, unspecified whether with hypoxia or hypercapnia: Secondary | ICD-10-CM | POA: Diagnosis present

## 2015-01-21 DIAGNOSIS — I519 Heart disease, unspecified: Secondary | ICD-10-CM | POA: Diagnosis not present

## 2015-01-21 DIAGNOSIS — E162 Hypoglycemia, unspecified: Secondary | ICD-10-CM | POA: Diagnosis not present

## 2015-01-21 DIAGNOSIS — K922 Gastrointestinal hemorrhage, unspecified: Secondary | ICD-10-CM | POA: Diagnosis present

## 2015-01-21 DIAGNOSIS — E871 Hypo-osmolality and hyponatremia: Secondary | ICD-10-CM | POA: Diagnosis present

## 2015-01-21 DIAGNOSIS — F17210 Nicotine dependence, cigarettes, uncomplicated: Secondary | ICD-10-CM | POA: Diagnosis present

## 2015-01-21 DIAGNOSIS — I509 Heart failure, unspecified: Secondary | ICD-10-CM | POA: Diagnosis present

## 2015-01-21 DIAGNOSIS — R739 Hyperglycemia, unspecified: Secondary | ICD-10-CM | POA: Diagnosis present

## 2015-01-21 DIAGNOSIS — J9601 Acute respiratory failure with hypoxia: Secondary | ICD-10-CM | POA: Diagnosis present

## 2015-01-21 DIAGNOSIS — R680 Hypothermia, not associated with low environmental temperature: Secondary | ICD-10-CM | POA: Diagnosis present

## 2015-01-21 DIAGNOSIS — Z8249 Family history of ischemic heart disease and other diseases of the circulatory system: Secondary | ICD-10-CM | POA: Diagnosis not present

## 2015-01-21 DIAGNOSIS — Z7982 Long term (current) use of aspirin: Secondary | ICD-10-CM | POA: Diagnosis not present

## 2015-01-21 DIAGNOSIS — D62 Acute posthemorrhagic anemia: Secondary | ICD-10-CM | POA: Diagnosis present

## 2015-01-21 DIAGNOSIS — Z6841 Body Mass Index (BMI) 40.0 and over, adult: Secondary | ICD-10-CM | POA: Diagnosis not present

## 2015-01-21 DIAGNOSIS — J690 Pneumonitis due to inhalation of food and vomit: Secondary | ICD-10-CM | POA: Diagnosis present

## 2015-01-21 DIAGNOSIS — I469 Cardiac arrest, cause unspecified: Secondary | ICD-10-CM | POA: Diagnosis present

## 2015-01-21 DIAGNOSIS — I10 Essential (primary) hypertension: Secondary | ICD-10-CM | POA: Diagnosis present

## 2015-01-21 DIAGNOSIS — F1020 Alcohol dependence, uncomplicated: Secondary | ICD-10-CM | POA: Diagnosis present

## 2015-01-21 DIAGNOSIS — R40243 Glasgow coma scale score 3-8: Secondary | ICD-10-CM | POA: Diagnosis present

## 2015-01-21 DIAGNOSIS — G931 Anoxic brain damage, not elsewhere classified: Secondary | ICD-10-CM | POA: Diagnosis present

## 2015-01-21 DIAGNOSIS — Z515 Encounter for palliative care: Secondary | ICD-10-CM | POA: Diagnosis not present

## 2015-01-21 DIAGNOSIS — E669 Obesity, unspecified: Secondary | ICD-10-CM | POA: Diagnosis present

## 2015-01-21 DIAGNOSIS — Z66 Do not resuscitate: Secondary | ICD-10-CM | POA: Diagnosis not present

## 2015-01-21 DIAGNOSIS — G253 Myoclonus: Secondary | ICD-10-CM | POA: Diagnosis not present

## 2015-01-21 DIAGNOSIS — D500 Iron deficiency anemia secondary to blood loss (chronic): Secondary | ICD-10-CM | POA: Diagnosis present

## 2015-01-21 DIAGNOSIS — E872 Acidosis: Secondary | ICD-10-CM | POA: Diagnosis present

## 2015-01-21 DIAGNOSIS — I5032 Chronic diastolic (congestive) heart failure: Secondary | ICD-10-CM | POA: Diagnosis present

## 2015-01-21 DIAGNOSIS — R7989 Other specified abnormal findings of blood chemistry: Secondary | ICD-10-CM | POA: Diagnosis not present

## 2015-01-21 DIAGNOSIS — D509 Iron deficiency anemia, unspecified: Secondary | ICD-10-CM | POA: Diagnosis present

## 2015-01-21 DIAGNOSIS — G9341 Metabolic encephalopathy: Secondary | ICD-10-CM | POA: Diagnosis present

## 2015-01-21 LAB — GLUCOSE, CAPILLARY
GLUCOSE-CAPILLARY: 118 mg/dL — AB (ref 70–99)
Glucose-Capillary: 100 mg/dL — ABNORMAL HIGH (ref 70–99)

## 2015-01-21 LAB — DIFFERENTIAL
BASOS PCT: 0 % (ref 0–1)
Basophils Absolute: 0.1 10*3/uL (ref 0.0–0.1)
EOS PCT: 1 % (ref 0–5)
Eosinophils Absolute: 0.1 10*3/uL (ref 0.0–0.7)
LYMPHS ABS: 2.3 10*3/uL (ref 0.7–4.0)
Lymphocytes Relative: 18 % (ref 12–46)
MONOS PCT: 6 % (ref 3–12)
Monocytes Absolute: 0.7 10*3/uL (ref 0.1–1.0)
Neutro Abs: 9.7 10*3/uL — ABNORMAL HIGH (ref 1.7–7.7)
Neutrophils Relative %: 75 % (ref 43–77)

## 2015-01-21 LAB — CBC
HCT: 26.2 % — ABNORMAL LOW (ref 36.0–46.0)
HCT: 27.3 % — ABNORMAL LOW (ref 36.0–46.0)
HEMATOCRIT: 26.7 % — AB (ref 36.0–46.0)
HEMOGLOBIN: 7.9 g/dL — AB (ref 12.0–15.0)
Hemoglobin: 7.9 g/dL — ABNORMAL LOW (ref 12.0–15.0)
Hemoglobin: 8.1 g/dL — ABNORMAL LOW (ref 12.0–15.0)
MCH: 22.8 pg — AB (ref 26.0–34.0)
MCH: 22.9 pg — AB (ref 26.0–34.0)
MCH: 22.8 pg — ABNORMAL LOW (ref 26.0–34.0)
MCHC: 30.2 g/dL (ref 30.0–36.0)
MCHC: 29.7 g/dL — ABNORMAL LOW (ref 30.0–36.0)
MCHC: 29.6 g/dL — ABNORMAL LOW (ref 30.0–36.0)
MCV: 75.5 fL — AB (ref 78.0–100.0)
MCV: 77.1 fL — AB (ref 78.0–100.0)
MCV: 77.2 fL — ABNORMAL LOW (ref 78.0–100.0)
PLATELETS: 135 10*3/uL — AB (ref 150–400)
PLATELETS: 103 10*3/uL — AB (ref 150–400)
Platelets: 94 10*3/uL — ABNORMAL LOW (ref 150–400)
RBC: 3.47 MIL/uL — ABNORMAL LOW (ref 3.87–5.11)
RBC: 3.54 MIL/uL — ABNORMAL LOW (ref 3.87–5.11)
RBC: 3.46 MIL/uL — ABNORMAL LOW (ref 3.87–5.11)
RDW: 20.4 % — AB (ref 11.5–15.5)
RDW: 20.7 % — ABNORMAL HIGH (ref 11.5–15.5)
RDW: 20.6 % — ABNORMAL HIGH (ref 11.5–15.5)
WBC: 7.9 10*3/uL (ref 4.0–10.5)
WBC: 12.1 10*3/uL — ABNORMAL HIGH (ref 4.0–10.5)
WBC: 12.3 10*3/uL — AB (ref 4.0–10.5)

## 2015-01-21 LAB — URINALYSIS, ROUTINE W REFLEX MICROSCOPIC
Glucose, UA: NEGATIVE mg/dL
Ketones, ur: 15 mg/dL — AB
LEUKOCYTES UA: NEGATIVE
NITRITE: NEGATIVE
Protein, ur: 100 mg/dL — AB
SPECIFIC GRAVITY, URINE: 1.024 (ref 1.005–1.030)
Urobilinogen, UA: 0.2 mg/dL (ref 0.0–1.0)
pH: 5.5 (ref 5.0–8.0)

## 2015-01-21 LAB — URINE MICROSCOPIC-ADD ON

## 2015-01-21 LAB — BASIC METABOLIC PANEL
ANION GAP: 6 (ref 5–15)
Anion gap: 13 (ref 5–15)
Anion gap: 10 (ref 5–15)
BUN: 6 mg/dL (ref 6–23)
BUN: 7 mg/dL (ref 6–23)
BUN: 7 mg/dL (ref 6–23)
CALCIUM: 9.1 mg/dL (ref 8.4–10.5)
CALCIUM: 8.5 mg/dL (ref 8.4–10.5)
CHLORIDE: 105 mmol/L (ref 96–112)
CO2: 23 mmol/L (ref 19–32)
CO2: 17 mmol/L — AB (ref 19–32)
CO2: 18 mmol/L — ABNORMAL LOW (ref 19–32)
CREATININE: 1 mg/dL (ref 0.50–1.10)
Calcium: 8.6 mg/dL (ref 8.4–10.5)
Chloride: 108 mmol/L (ref 96–112)
Chloride: 109 mmol/L (ref 96–112)
Creatinine, Ser: 0.87 mg/dL (ref 0.50–1.10)
Creatinine, Ser: 1.2 mg/dL — ABNORMAL HIGH (ref 0.50–1.10)
GFR calc Af Amer: 73 mL/min — ABNORMAL LOW (ref >90)
GFR, EST AFRICAN AMERICAN: 87 mL/min — AB (ref >90)
GFR, EST AFRICAN AMERICAN: 59 mL/min — AB (ref >90)
GFR, EST NON AFRICAN AMERICAN: 75 mL/min — AB (ref >90)
GFR, EST NON AFRICAN AMERICAN: 51 mL/min — AB (ref >90)
GFR, EST NON AFRICAN AMERICAN: 63 mL/min — AB (ref >90)
GLUCOSE: 112 mg/dL — AB (ref 70–99)
Glucose, Bld: 100 mg/dL — ABNORMAL HIGH (ref 70–99)
Glucose, Bld: 169 mg/dL — ABNORMAL HIGH (ref 70–99)
Potassium: 3.8 mmol/L (ref 3.5–5.1)
Potassium: 4 mmol/L (ref 3.5–5.1)
Potassium: 4.3 mmol/L (ref 3.5–5.1)
Sodium: 137 mmol/L (ref 135–145)
Sodium: 135 mmol/L (ref 135–145)
Sodium: 137 mmol/L (ref 135–145)

## 2015-01-21 LAB — PROTIME-INR
INR: 1.39 (ref 0.00–1.49)
INR: 1.36 (ref 0.00–1.49)
PROTHROMBIN TIME: 16.9 s — AB (ref 11.6–15.2)
Prothrombin Time: 17.2 seconds — ABNORMAL HIGH (ref 11.6–15.2)

## 2015-01-21 LAB — I-STAT ARTERIAL BLOOD GAS, ED
Acid-base deficit: 9 mmol/L — ABNORMAL HIGH (ref 0.0–2.0)
Bicarbonate: 18.2 mEq/L — ABNORMAL LOW (ref 20.0–24.0)
O2 SAT: 100 %
PCO2 ART: 45.7 mmHg — AB (ref 35.0–45.0)
PO2 ART: 418 mmHg — AB (ref 80.0–100.0)
Patient temperature: 97.4
TCO2: 20 mmol/L (ref 0–100)
pH, Arterial: 7.204 — ABNORMAL LOW (ref 7.350–7.450)

## 2015-01-21 LAB — I-STAT CG4 LACTIC ACID, ED: Lactic Acid, Venous: 7.95 mmol/L (ref 0.5–2.0)

## 2015-01-21 LAB — TROPONIN I: Troponin I: <0.03 ng/mL (ref <0.031)

## 2015-01-21 LAB — I-STAT CHEM 8, ED
BUN: 9 mg/dL (ref 6–23)
CHLORIDE: 104 mmol/L (ref 96–112)
Calcium, Ion: 1.13 mmol/L (ref 1.12–1.23)
Creatinine, Ser: 0.9 mg/dL (ref 0.50–1.10)
Glucose, Bld: 165 mg/dL — ABNORMAL HIGH (ref 70–99)
HCT: 31 % — ABNORMAL LOW (ref 36.0–46.0)
Hemoglobin: 10.5 g/dL — ABNORMAL LOW (ref 12.0–15.0)
POTASSIUM: 4.5 mmol/L (ref 3.5–5.1)
SODIUM: 136 mmol/L (ref 135–145)
TCO2: 16 mmol/L (ref 0–100)

## 2015-01-21 LAB — I-STAT TROPONIN, ED: TROPONIN I, POC: 0 ng/mL (ref 0.00–0.08)

## 2015-01-21 LAB — HEMOGLOBIN A1C
HEMOGLOBIN A1C: 5.4 % (ref 4.8–5.6)
Mean Plasma Glucose: 108 mg/dL

## 2015-01-21 LAB — APTT
aPTT: 38 seconds — ABNORMAL HIGH (ref 24–37)
aPTT: 36 seconds (ref 24–37)

## 2015-01-21 LAB — CBG MONITORING, ED: Glucose-Capillary: 98 mg/dL (ref 70–99)

## 2015-01-21 MED ORDER — SODIUM CHLORIDE 0.9 % IV SOLN: 2000.0000 mL | Freq: Once | INTRAVENOUS | Status: DC

## 2015-01-21 MED ORDER — HEPARIN SODIUM (PORCINE) 5000 UNIT/ML IJ SOLN
5000.0000 [IU] | Freq: Three times a day (TID) | INTRAMUSCULAR | Status: DC
  Administered 2015-01-21 – 2015-01-23 (×4): 5000 [IU] via SUBCUTANEOUS
  Filled 2015-01-21 (×10): qty 1

## 2015-01-21 MED ORDER — ETOMIDATE 2 MG/ML IV SOLN
INTRAVENOUS | Status: AC
  Administered 2015-01-21: 20 mg
  Filled 2015-01-21: qty 20

## 2015-01-21 MED ORDER — SODIUM CHLORIDE 0.9 % IV SOLN
25.0000 ug/h | INTRAVENOUS | Status: DC
  Filled 2015-01-21: qty 50

## 2015-01-21 MED ORDER — SODIUM CHLORIDE 0.9 % IV BOLUS (SEPSIS)
1000.0000 mL | Freq: Once | INTRAVENOUS | Status: AC
  Administered 2015-01-21: 1000 mL via INTRAVENOUS

## 2015-01-21 MED ORDER — SODIUM CHLORIDE 0.9 % IV SOLN
1.0000 mg/h | INTRAVENOUS | Status: DC
  Filled 2015-01-21: qty 10

## 2015-01-21 MED ORDER — INSULIN ASPART 100 UNIT/ML ~~LOC~~ SOLN
0.0000 [IU] | SUBCUTANEOUS | Status: DC
  Administered 2015-01-22 (×2): 3 [IU] via SUBCUTANEOUS

## 2015-01-21 MED ORDER — NOREPINEPHRINE BITARTRATE 1 MG/ML IV SOLN
0.0000 ug/min | INTRAVENOUS | Status: DC
  Filled 2015-01-21: qty 4

## 2015-01-21 MED ORDER — SODIUM CHLORIDE 0.9 % IV SOLN
25.0000 ug/h | INTRAVENOUS | Status: DC
  Administered 2015-01-21: 50 ug/h via INTRAVENOUS
  Filled 2015-01-21: qty 50

## 2015-01-21 MED ORDER — SODIUM CHLORIDE 0.9 % IV SOLN
2000.0000 mL | Freq: Once | INTRAVENOUS | Status: AC
  Administered 2015-01-21: 2000 mL via INTRAVENOUS

## 2015-01-21 MED ORDER — MIDAZOLAM HCL 2 MG/2ML IJ SOLN: 2.0000 mg | INTRAMUSCULAR | Status: DC | PRN

## 2015-01-21 MED ORDER — SODIUM CHLORIDE 0.9 % IV SOLN
INTRAVENOUS | Status: DC
  Administered 2015-01-22: via INTRAVENOUS

## 2015-01-21 MED ORDER — FENTANYL CITRATE 0.05 MG/ML IJ SOLN: 100.0000 ug | INTRAMUSCULAR | Status: DC | PRN

## 2015-01-21 MED ORDER — ASPIRIN 300 MG RE SUPP: 300.0000 mg | RECTAL | Status: DC

## 2015-01-21 MED ORDER — FENTANYL CITRATE 0.05 MG/ML IJ SOLN: 50.0000 ug | Freq: Once | INTRAMUSCULAR | Status: DC

## 2015-01-21 MED ORDER — FENTANYL CITRATE 0.05 MG/ML IJ SOLN
50.0000 ug | Freq: Once | INTRAMUSCULAR | Status: AC
  Administered 2015-01-21: 50 ug via INTRAVENOUS

## 2015-01-21 MED ORDER — FOLIC ACID 5 MG/ML IJ SOLN
1.0000 mg | Freq: Every day | INTRAMUSCULAR | Status: DC
  Administered 2015-01-22: 1 mg via INTRAVENOUS
  Filled 2015-01-21: qty 0.2

## 2015-01-21 MED ORDER — LIDOCAINE HCL (CARDIAC) 20 MG/ML IV SOLN
INTRAVENOUS | Status: AC
  Filled 2015-01-21: qty 5

## 2015-01-21 MED ORDER — THIAMINE HCL 100 MG/ML IJ SOLN
100.0000 mg | Freq: Every day | INTRAMUSCULAR | Status: DC
  Administered 2015-01-22: 100 mg via INTRAVENOUS
  Filled 2015-01-21: qty 1

## 2015-01-21 MED ORDER — SUCCINYLCHOLINE CHLORIDE 20 MG/ML IJ SOLN
INTRAMUSCULAR | Status: AC
  Filled 2015-01-21: qty 1

## 2015-01-21 MED ORDER — FENTANYL CITRATE 0.05 MG/ML IJ SOLN
INTRAMUSCULAR | Status: AC
  Filled 2015-01-21: qty 2

## 2015-01-21 MED ORDER — PANTOPRAZOLE SODIUM 40 MG IV SOLR
40.0000 mg | Freq: Every day | INTRAVENOUS | Status: DC
  Administered 2015-01-21 – 2015-01-22 (×2): 40 mg via INTRAVENOUS
  Filled 2015-01-21 (×4): qty 40

## 2015-01-21 MED ORDER — CETYLPYRIDINIUM CHLORIDE 0.05 % MT LIQD
7.0000 mL | Freq: Four times a day (QID) | OROMUCOSAL | Status: DC
  Administered 2015-01-22 – 2015-01-23 (×5): 7 mL via OROMUCOSAL

## 2015-01-21 MED ORDER — PANTOPRAZOLE SODIUM 40 MG PO TBEC: 40.0000 mg | DELAYED_RELEASE_TABLET | Freq: Every day | ORAL | Status: AC

## 2015-01-21 MED ORDER — SODIUM CHLORIDE 0.9 % IV SOLN
1.0000 ug/kg/min | INTRAVENOUS | Status: DC
  Filled 2015-01-21: qty 20

## 2015-01-21 MED ORDER — IOHEXOL 350 MG/ML SOLN
100.0000 mL | Freq: Once | INTRAVENOUS | Status: AC | PRN
  Administered 2015-01-21: 100 mL via INTRAVENOUS

## 2015-01-21 MED ORDER — ASPIRIN 300 MG RE SUPP
300.0000 mg | RECTAL | Status: AC
  Administered 2015-01-21: 300 mg via RECTAL
  Filled 2015-01-21: qty 1

## 2015-01-21 MED ORDER — FENTANYL BOLUS VIA INFUSION
50.0000 ug | INTRAVENOUS | Status: DC | PRN
  Filled 2015-01-21: qty 50

## 2015-01-21 MED ORDER — CHLORHEXIDINE GLUCONATE 0.12 % MT SOLN
15.0000 mL | Freq: Two times a day (BID) | OROMUCOSAL | Status: DC
  Administered 2015-01-22 – 2015-01-23 (×4): 15 mL via OROMUCOSAL
  Filled 2015-01-21 (×6): qty 15

## 2015-01-21 MED ORDER — ROCURONIUM BROMIDE 50 MG/5ML IV SOLN
INTRAVENOUS | Status: AC
  Administered 2015-01-21: 100 mg
  Filled 2015-01-21: qty 2

## 2015-01-21 MED ORDER — POLYSACCHARIDE IRON COMPLEX 150 MG PO CAPS: 150.0000 mg | ORAL_CAPSULE | Freq: Every day | ORAL | Status: AC

## 2015-01-21 NOTE — ED Notes (Signed)
Patient transported to CT 

## 2015-01-21 NOTE — Progress Notes (Signed)
NURSING PROGRESS NOTE  Lorenz CoasterBrenda L Manna [id number] Discharge Data: 01/20/2015 12:59 PM Attending Provider: Maretta BeesShanker M Ghimire, MD JYN:WGNFPCP:DEAN, ERIC, MD   Lorenz CoasterBrenda L Leavy to be D/C'd Home per MD order.    All IV's will be discontinued and monitored for bleeding.  All belongings will be returned to patient for patient to take home.  Last Documented Vital Signs:  Blood pressure 147/84, pulse 86, temperature 99.2 F (37.3 C), temperature source Oral, resp. rate 24, height 5\' 4"  (1.626 m), weight 110.587 kg (243 lb 12.8 oz), SpO2 100 %.  Madelin RearLonnie Audreena Sachdeva, MSN, RN, [employer]RN

## 2015-01-21 NOTE — Discharge Summary (Signed)
PATIENT DETAILS Name: Patricia Mejia Age: 54 y.o. Sex: female Date of Birth: 1961-01-11 MRN: [id number]. Admitting Physician: Jerene Bears, Mejia OZH:YQMV, Patricia Mejia  Admit Date: 01/17/2015 Discharge date: 01/06/2015  Recommendations for Outpatient Follow-up:  1. Please check CBC in 1 week. Patient will require very close monitoring of CBC from here on out.  PRIMARY DISCHARGE DIAGNOSIS:  Active Problems:   Anemia   Symptomatic anemia   Acute blood loss anemia   Abnormal LFTs   Hypoalbuminemia   Congestive heart disease   Hyponatremia   Hypoglycemia   Alcoholism   Absolute anemia   Diastolic dysfunction      PAST MEDICAL HISTORY: Past Medical History  Diagnosis Date  . Hypertension   . CHF (congestive heart failure)     Diagnosed in 08/01/2012    DISCHARGE MEDICATIONS: Current Discharge Medication List    START taking these medications   Details  iron polysaccharides (NIFEREX) 150 MG capsule Take 1 capsule (150 mg total) by mouth daily. Qty: 60 capsule, Refills: 0    pantoprazole (PROTONIX) 40 MG tablet Take 1 tablet (40 mg total) by mouth daily. Qty: 30 tablet, Refills: 0      CONTINUE these medications which have NOT CHANGED   Details  carvedilol (COREG) 12.5 MG tablet Take 12.5 mg by mouth 2 (two) times daily with a meal.    cholecalciferol (VITAMIN D) 1000 UNITS tablet Take 2,000 Units by mouth daily.    furosemide (LASIX) 20 MG tablet Take 20 mg by mouth daily.    potassium chloride SA (K-DUR,KLOR-CON) 20 MEQ tablet Take 20 mEq by mouth daily.      STOP taking these medications     aspirin EC 81 MG tablet         ALLERGIES:   Allergies  Allergen Reactions  . Benazepril Hcl     REACTION: angioedema  . Latex Itching and Rash    BRIEF HPI:  See H&P, Labs, Consult and Test reports for all details in brief, patient was is a 85 of female with a history of chronic diastolic heart failure, hypertension who was admitted for evaluation of  shortness of breath and fatigue. She was found to have a significantly low hemoglobin level. She was then admitted for further evaluation and treatment  CONSULTATIONS:   GI  PERTINENT RADIOLOGIC STUDIES: Dg Chest Port 1 View  01/18/2015   CLINICAL DATA:  Dyspnea  EXAM: PORTABLE CHEST - 1 VIEW  COMPARISON:  08/31/2013  FINDINGS: A single AP portable view of the chest demonstrates no focal airspace consolidation or alveolar edema. The lungs are grossly clear. There is no large effusion or pneumothorax. There is unchanged cardiomegaly. Cardiac and mediastinal contours are otherwise unremarkable.  IMPRESSION: Unchanged cardiomegaly.  No acute findings are evident.   Electronically Signed   By: Ellery Plunk M.D.   On: 01/18/2015 00:12     PERTINENT LAB RESULTS: CBC:  Recent Labs  01/20/15 0833 12/31/2014 0820  WBC 7.0 7.9  HGB 7.9* 7.9*  HCT 25.7* 26.2*  PLT 132* 135*   CMET CMP     Component Value Date/Time   NA 137 01/18/2015 0820   K 3.8 01/08/2015 0820   CL 108 01/05/2015 0820   CO2 23 01/18/2015 0820   GLUCOSE 100* 01/25/2015 0820   BUN 6 01/01/2015 0820   CREATININE 0.87 01/26/2015 0820   CALCIUM 9.1 01/14/2015 0820   PROT 7.0 01/18/2015 2031   ALBUMIN 3.0* 01/18/2015 2031   AST 36  01/18/2015 2031   ALT 37* 01/18/2015 2031   ALKPHOS 93 01/18/2015 2031   BILITOT 0.7 01/18/2015 2031   GFRNONAA 75* 2015/03/27 0820   GFRAA 87* 2015/03/27 0820    GFR Estimated Creatinine Clearance: 91 mL/min (by C-G formula based on Cr of 0.87). No results for input(s): LIPASE, AMYLASE in the last 72 hours. No results for input(s): CKTOTAL, CKMB, CKMBINDEX, TROPONINI in the last 72 hours. Invalid input(s): POCBNP No results for input(s): DDIMER in the last 72 hours.  Recent Labs  01/18/15 2031 01/20/15 0833  HGBA1C 5.5 5.4    Recent Labs  01/18/15 2031  CHOL 117  HDL 57  LDLCALC 47  TRIG 64  CHOLHDL 2.1   No results for input(s): TSH, T4TOTAL, T3FREE, THYROIDAB in  the last 72 hours.  Invalid input(s): FREET3 No results for input(s): VITAMINB12, FOLATE, FERRITIN, TIBC, IRON, RETICCTPCT in the last 72 hours. Coags: No results for input(s): INR in the last 72 hours.  Invalid input(s): PT Microbiology: No results found for this or any previous visit (from the past 240 hour(s)).   BRIEF HOSPITAL COURSE:  ?Subacute Blood Loss Anemia: Suspected slow GI loss, as no overt bleeding evident while inpatient, she does have a history of intermittent hematochezia.. Given 2 units of PRBC on admission, hemoglobin today is stable at 7.9. Was given  IV iron while inpatient. Will need to discharge home on oral iron. GI was consulted,  EGD/colonoscopy was done, endoscopy showed upper intestinal angioectasia which likely was causing blood loss anemia. Patient will need close monitoring of CBC in the outpatient setting. .   Suspected lower GI bleed: Given history of hematochezia and severe microcytic/iron deficiency anemia-GI consulted. No overt bleeding noted during hospitalization. 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 Colonoscopy also showed a well-defined ulceration along the ileocecal valve. Patient will need colonoscopy biopsy results followed as an outpatient. He should also need continued follow-up with gastroenterology for close monitoring.   Hypoglycemia:? Etiology. Suspect may have been related to poor oral intake. This resolved with stabilization and resumption of regular diet. No further hypoglycemic episodes seen in the past few days.   Chronic diastolic heart failure: Clinically compensated. Resume Lasix on discharge   Mild hyponatremia/hypokalemia: Resolved.   History of EtOH abuse: No signs of withdrawal. Was on Ativan per CIWA protocol.   Mildly elevated liver enzymes: Likely secondary to alcohol, downtrending. Monitor periodically as outpatient   Tobacco abuse: Counseled  extensively   TODAY-DAY OF DISCHARGE:  Subjective:   Patricia Mejia today has no headache,no chest abdominal pain,no new weakness tingling or numbness, feels much better wants to go home today  Objective:   Blood pressure 147/84, pulse 86, temperature 99.2 F (37.3 C), temperature source Oral, resp. rate 24, height 5\' 4"  (1.626 m), weight 110.587 kg (243 lb 12.8 oz), SpO2 100 %.  Intake/Output Summary (Last 24 hours) at 12/28/14 0959 Last data filed at 12/28/14 0700  Gross per 24 hour  Intake 2073.25 ml  Output    250 ml  Net 1823.25 ml   Filed Weights   01/19/15 1031 01/20/15 0414 12/28/14 0619  Weight: 108.466 kg (239 lb 2 oz) 109.59 kg (241 lb 9.6 oz) 110.587 kg (243 lb 12.8 oz)    Exam Awake Alert, Oriented *3, No new F.N deficits, Normal affect [place].AT,PERRAL Supple Neck,No JVD, No cervical lymphadenopathy appriciated.  Symmetrical Chest wall movement, Good air movement bilaterally, CTAB RRR,No Gallops,Rubs or new Murmurs, No Parasternal Heave +  ve B.Sounds, Abd Soft, Non tender, No organomegaly appriciated, No rebound -guarding or rigidity. No Cyanosis, Clubbing or edema, No new Rash or bruise  DISCHARGE CONDITION: Stable  DISPOSITION: Home  DISCHARGE INSTRUCTIONS:    Activity:  As tolerated   Diet recommendation: Heart Healthy diet  Discharge Instructions    Call Mejia for:  difficulty breathing, headache or visual disturbances    Complete by:  As directed      Call Mejia for:  extreme fatigue    Complete by:  As directed      Call Mejia for:  persistant dizziness or light-headedness    Complete by:  As directed      Call Mejia for:    Complete by:  As directed   Black stools     Diet - low sodium heart healthy    Complete by:  As directed      Increase activity slowly    Complete by:  As directed            Follow-up Information    Follow up with August Saucer Patricia Mejia On 01/30/2015.   Specialty:  Internal Medicine   Why:  Appointment with Dr. August Saucer is on 01/30/15 at  11:15am   Contact information:   [address] [place] [address] [phone]      Total Time spent on discharge equals 45 minutes.  SignedJeoffrey Massed 01/05/2015 9:59 AM

## 2015-01-21 NOTE — Progress Notes (Signed)
Chaplain responded to page from ED.  Pt was post CPR, family escorted to consult room where MD updated family on pt's status.  Pt's significant other was first to arrive then pt's daughter and grandson, numerous other family members arrived later.  Critical care PA gave family further updates and some family allowed to see pt.  RN informed family that pt was waiting for bed in 2H so chaplain escorted family to [facility] waiting area and informed unit secretary of family's location.  Chaplain provided emotional support as well as ministry of presence and hospitality.  Chaplain will follow up as needed.    Jul 29, 2015 2130  Clinical Encounter Type  Visited With Patient;Family;Patient and family together  Visit Type Initial;Spiritual support;Social support;Critical Care;ED  Referral From Care management  Spiritual Encounters  Spiritual Needs Emotional;Grief support  Stress Factors  Patient Stress Factors Health changes  Family Stress Factors Exhausted;Family relationships;Health changes;Lack of knowledge;Loss of control  Advance Directives (For Healthcare)  Does patient have an advance directive? No   Blain PaisOvercash, Rashawd Laskaris A, Chaplain May 24, 2015

## 2015-01-21 NOTE — ED Provider Notes (Signed)
CSN: [id number]     Arrival date & time 01/09/2015  2047 History   First MD Initiated Contact with Patient 01/13/2015 2112     No chief complaint on file.    (Consider location/radiation/quality/duration/timing/severity/associated sxs/prior Treatment) HPI  54 year old female presents after a cardiac arrest. EMS was called because the patient was found unresponsive by the boyfriend. According to him the patient called him because she was "not feeling well". Patient was discharged earlier this morning after receiving a blood transfusion for anemia. Otherwise is unknown if patient has been feeling ill since discharge. Patient was found to be in PEA by EMS and was given 7 epinephrines. Patient has seemed to intermittently breathes on her own.  Past Medical History  Diagnosis Date  . Hypertension   . CHF (congestive heart failure)     Diagnosed in 08/01/2012   Past Surgical History  Procedure Laterality Date  . Appendectomy    . Esophagogastroduodenoscopy N/A 01/20/2015    Procedure: ESOPHAGOGASTRODUODENOSCOPY (EGD);  Surgeon: Willis Modena, MD;  Location: [facility] ENDOSCOPY;  Service: Endoscopy;  Laterality: N/A;  . Colonoscopy N/A 01/20/2015    Procedure: COLONOSCOPY;  Surgeon: Willis Modena, MD;  Location: [facility] ENDOSCOPY;  Service: Endoscopy;  Laterality: N/A;   Family History  Problem Relation Age of Onset  . Alcohol abuse Mother   . Cirrhosis Mother   . Hypertension Mother   . Hypertension Father   . Colon cancer Maternal Uncle   . Colon cancer Maternal Uncle    History  Substance Use Topics  . Smoking status: Current Every Day Smoker -- 0.50 packs/day for 29 years    Types: Cigarettes  . Smokeless tobacco: Never Used  . Alcohol Use: Yes   OB History    No data available     Review of Systems  Unable to perform ROS: Patient unresponsive      Allergies  Benazepril hcl and Latex  Home Medications   Prior to Admission medications   Medication Sig Start Date End Date Taking?  Authorizing Provider  carvedilol (COREG) 12.5 MG tablet Take 12.5 mg by mouth 2 (two) times daily with a meal.    Historical Provider, MD  cholecalciferol (VITAMIN D) 1000 UNITS tablet Take 2,000 Units by mouth daily.    Historical Provider, MD  furosemide (LASIX) 20 MG tablet Take 20 mg by mouth daily.    Historical Provider, MD  iron polysaccharides (NIFEREX) 150 MG capsule Take 1 capsule (150 mg total) by mouth daily. 12/30/2014   Shanker Levora Dredge, MD  pantoprazole (PROTONIX) 40 MG tablet Take 1 tablet (40 mg total) by mouth daily. 01/10/2015   Shanker Levora Dredge, MD  potassium chloride SA (K-DUR,KLOR-CON) 20 MEQ tablet Take 20 mEq by mouth daily.    Historical Provider, MD   BP 135/100 mmHg  Pulse 72  Temp(Src) 93.7 F (34.3 C)  Resp 16  SpO2 100% Physical Exam  Constitutional: She appears well-developed and well-nourished. She is intubated.  obese  HENT:  Head: Normocephalic and atraumatic.  Right Ear: External ear normal.  Left Ear: External ear normal.  Nose: Nose normal.  Eyes: Right eye exhibits no discharge. Left eye exhibits no discharge.  Pinpoint pupils  Neck: Neck supple.  Cardiovascular: Normal rate, regular rhythm and normal heart sounds.   Pulses:      Radial pulses are 1+ on the right side, and 1+ on the left side.  Pulmonary/Chest: Breath sounds normal. Bradypnea noted. She is intubated. She has no wheezes.  Abdominal: Soft. She  exhibits distension.  Neurological: She is unresponsive. GCS eye subscore is 1. GCS verbal subscore is 1. GCS motor subscore is 1.  Skin: Skin is warm and dry. She is not diaphoretic.  Vitals reviewed.   ED Course  INTUBATION Date/Time: 01/10/2015 9:20 PM Performed by: Pricilla LovelessGOLDSTON, Leonte Horrigan T Authorized by: Pricilla LovelessGOLDSTON, Lamarcus Spira T Consent: The procedure was performed in an emergent situation. Patient identity confirmed: anonymous protocol, patient vented/unresponsive Time out: Immediately prior to procedure a "time out" was called to verify the  correct patient, procedure, equipment, support staff and site/side marked as required. Indications: respiratory failure and  airway protection Intubation method: video-assisted Patient status: paralyzed (RSI) Sedatives: etomidate Paralytic: rocuronium Tube size: 7.5 mm Tube type: cuffed Number of attempts: 1 Cricoid pressure: no Cords visualized: yes Post-procedure assessment: chest rise,  CO2 detector and ETCO2 monitor Breath sounds: equal and absent over the epigastrium Cuff inflated: yes ETT to lip: 23 cm Tube secured with: ETT holder Chest x-ray interpreted by me. Chest x-ray findings: endotracheal tube in appropriate position Patient tolerance: Patient tolerated the procedure well with no immediate complications   (including critical care time) Labs Review Labs Reviewed  CBC - Abnormal; Notable for the following:    RBC 3.54 (*)    Hemoglobin 8.1 (*)    HCT 27.3 (*)    MCV 77.1 (*)    MCH 22.9 (*)    MCHC 29.7 (*)    RDW 20.7 (*)    All other components within normal limits  APTT - Abnormal; Notable for the following:    aPTT 38 (*)    All other components within normal limits  PROTIME-INR - Abnormal; Notable for the following:    Prothrombin Time 17.2 (*)    All other components within normal limits  DIFFERENTIAL - Abnormal; Notable for the following:    Neutro Abs 9.7 (*)    All other components within normal limits  I-STAT CG4 LACTIC ACID, ED - Abnormal; Notable for the following:    Lactic Acid, Venous 7.95 (*)    All other components within normal limits  I-STAT CHEM 8, ED - Abnormal; Notable for the following:    Glucose, Bld 165 (*)    Hemoglobin 10.5 (*)    HCT 31.0 (*)    All other components within normal limits  I-STAT ARTERIAL BLOOD GAS, ED - Abnormal; Notable for the following:    pH, Arterial 7.204 (*)    pCO2 arterial 45.7 (*)    pO2, Arterial 418.0 (*)    Bicarbonate 18.2 (*)    Acid-base deficit 9.0 (*)    All other components within normal  limits  BLOOD GAS, ARTERIAL  BASIC METABOLIC PANEL  URINALYSIS, ROUTINE W REFLEX MICROSCOPIC  TROPONIN I  I-STAT TROPOININ, ED  CBG MONITORING, ED    Imaging Review Dg Chest Port 1 View  01/03/2015   CLINICAL DATA:  Endotracheal and enteric tube placements.  EXAM: PORTABLE CHEST - 1 VIEW  COMPARISON:  01/17/2015  FINDINGS: Of endotracheal tube is been placed with tip measuring 5.4 cm above the carinal. An enteric tube was placed with tip off the field of view but below the left hemidiaphragm. Shallow inspiration. Cardiac enlargement without significant pulmonary vascular congestion. No focal airspace consolidation in the lungs. No blunting of costophrenic angles. No pneumothorax.  IMPRESSION: Appliances appear in satisfactory position. Cardiac enlargement. No focal consolidation in the lungs appear   Electronically Signed   By: Burman NievesWilliam  Stevens M.D.   On: 01/26/2015 21:44  Date: 01/22/2015  Rate: 62  Rhythm: normal sinus rhythm  QRS Axis: normal  Intervals: normal  ST/T Wave abnormalities: normal  Conduction Disutrbances:none  Narrative Interpretation: ST changes from 2/20 resolved  Old EKG Reviewed: changes noted    CRITICAL CARE Performed by: Pricilla Loveless T   Total critical care time: 60 minutes  Critical care time was exclusive of separately billable procedures and treating other patients.  Critical care was necessary to treat or prevent imminent or life-threatening deterioration.  Critical care was time spent personally by me on the following activities: development of treatment plan with patient and/or surrogate as well as nursing, discussions with consultants, evaluation of patient's response to treatment, examination of patient, obtaining history from patient or surrogate, ordering and performing treatments and interventions, ordering and review of laboratory studies, ordering and review of radiographic studies, pulse oximetry and re-evaluation of patient's condition.   MDM   Final diagnoses:  Cardiac arrest    Patient found to have pulses but are difficult to feel due to obesity. Found with doppler and then confirmed with faint radial pulses. Has adequate BP here. Started intermittently breathing but still unresponsive to pain. Intubated after RSI. No hypotension after ROSC. Given down time around 30-45 minutes, cooling started then stopped after consultation with ICU. Given recent possible GI bleed and other factors, will hold on deep cooling per ICU. Will need CTA to r/o PE given recent hospital stay, but is currently stable and has adequate BP and thus will hold on empiric treatment. No evidence of STEMI. Admit to ICU in critical condition. Discussed condition with daughter and significant other and answered current questions.   Audree Camel, MD 01/14/2015 [phone]

## 2015-01-21 NOTE — ED Notes (Signed)
Called code cool to [facility] per Irving BurtonEmily

## 2015-01-21 NOTE — ED Notes (Signed)
Pt transported to CT, called 2H and told nurse pt would be coming up after CT. Pt transported with RN and RRT to CT and to Floor.

## 2015-01-21 NOTE — Code Documentation (Signed)
Per EMS pt given 7 epis on scene/en route. [facility]  Pt called family to come over and when they got there pt was unresponsive on their arrival. CPR has been in progess since 2011 with 10 -15 min  Downtime estimated prior to EMS arrival. Ems was able to regain pulses but lost them again en route. Pt on thumper, intubated, #22 in Right a/c and IO in left shin.

## 2015-01-21 NOTE — Progress Notes (Signed)
Subjective: Tolerating diet. No abdominal pain. No blood in stool.  Objective: Vital signs in last 24 hours: Temp:  [97.6 F (36.4 C)-99.2 F (37.3 C)] 99.2 F (37.3 C) (02/23 0623) Pulse Rate:  [78-100] 86 (02/23 0623) Resp:  [13-24] 24 (02/23 0623) BP: (104-183)/(60-101) 147/84 mmHg (02/23 0623) SpO2:  [99 %-100 %] 100 % (02/23 0623) Weight:  [110.587 kg (243 lb 12.8 oz)] 110.587 kg (243 lb 12.8 oz) (02/23 0619) Weight change: 2.121 kg (4 lb 10.8 oz) Last BM Date: 01/20/15  PE: GEN:  NAD ABD:  Soft  Lab Results: CBC    Component Value Date/Time   WBC 7.0 01/20/2015 0833   RBC 3.50* 01/20/2015 0833   RBC 2.97* 01/17/2015 2049   HGB 7.9* 01/20/2015 0833   HCT 25.7* 01/20/2015 0833   PLT 132* 01/20/2015 0833   MCV 73.4* 01/20/2015 0833   MCH 22.6* 01/20/2015 0833   MCHC 30.7 01/20/2015 0833   RDW 19.9* 01/20/2015 0833   LYMPHSABS 1.9 01/18/2015 2031   MONOABS 0.5 01/18/2015 2031   EOSABS 0.0 01/18/2015 2031   BASOSABS 0.0 01/18/2015 2031   CMP     Component Value Date/Time   NA 132* 01/18/2015 2031   K 3.2* 01/18/2015 2031   CL 99 01/18/2015 2031   CO2 25 01/18/2015 2031   GLUCOSE 122* 01/18/2015 2031   BUN 6 01/18/2015 2031   CREATININE 1.08 01/18/2015 2031   CALCIUM 8.6 01/18/2015 2031   PROT 7.0 01/18/2015 2031   ALBUMIN 3.0* 01/18/2015 2031   AST 36 01/18/2015 2031   ALT 37* 01/18/2015 2031   ALKPHOS 93 01/18/2015 2031   BILITOT 0.7 01/18/2015 2031   GFRNONAA 58* 01/18/2015 2031   GFRAA 67* 01/18/2015 2031   Assessment:  1.  Anemia.  Likely multifactorial, but suspect upper intestinal angioectasias seen on endoscopy might be playing significant role.  Doubt hemorrhoidal bleeding is causing significant anemia. 2.  Hematochezia, suspect due to hemorrhoids. 3.  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  Plan:  1.  Oral iron (Nu-Iron 150 mg po qd, or the equivalent) + pantoprazole 40 mg po qd, now and upon  discharge. 2.  Awaiting biopsy results. 3.  No further inpatient GI testing is anticipated. 4.  Topical hemorrhoidal therapy (e.g., Anusol-HC suppositories PR bid prn) as needed for hematochezia. 5.  Will arrange outpatient follow-up with me. 6.  Will sign-off; thanks for the consult; please call with questions.   Freddy JakschUTLAW,Patty Leitzke M August 21, 2015, 7:59 AM

## 2015-01-21 NOTE — ED Notes (Signed)
Dr. Feinstein at bedside   

## 2015-01-21 NOTE — H&P (Signed)
PULMONARY / CRITICAL CARE MEDICINE   Name: Patricia Mejia MRN: [id number] DOB: 03-08-1961    ADMISSION DATE:  January 31, 2015 CONSULTATION DATE:  31-Jan-2015  REFERRING MD :  EDP  CHIEF COMPLAINT:  PEA Arrest  INITIAL PRESENTATION:  54 y.o. F brought to [facility] ED 2/23 in PEA arrest.  ROSC roughly 40 - 50 minutes.  Discharged earlier same day after being admitted for acute blood loss anemia.   STUDIES:  Colonoscopy 2/22 >>> external hemorrhoids, 8mm cecal and 6mm sigmoid polyps removed with hot snare. 3mm rectal polyp removed and biopsied.  No diverticula.  Shallow well defined ulceration along IC valve, biopsies obtained. CXR 2/22 >>> widened mediastinum CTA chest 2/22 >>> CT head 2/22 >>>  SIGNIFICANT EVENTS: 2/23 - discharged 2/23 - admitted after PEA arrest, started on normothermia protocol (36 degrees)   HISTORY OF PRESENT ILLNESS:  Pt is encephalopathic; therefore, this HPI is obtained from chart review. Patricia Mejia is a 54 y.o. F with PMH as outlined below.  She was just discharged AM of 2/23 after being admitted for acute blood loss anemia in setting of hematochezia. Later that evening around 1930, pt called boyfriend and told him to come over.  He arrived at her home just a few minutes later (lives right across the street) and found her on the floor unresponsive.  He immediately called 911 and when EMS arrived maybe 5 minutes later, pt found to be in PEA.  ACLS protocol initiated and pt transported to [facility] ED.  There was question whether pt had ROSC due to very weak pulses.  In ED, pt back in PEA and had roughly 40 - 50 minutes of CPR with a total of  epinephrine before ROSC.  PCCM consulted for admission and consideration of hypothermia protocol.  On exam, pt non-responsive and currently hypothermic at 34C.  Of note, colonoscopy during her hospitalization (2/22) was negative for active bleed.  Hematochezia thought most likely due to hemorrhoids.   PAST MEDICAL HISTORY :   has a  past medical history of Hypertension and CHF (congestive heart failure).  has past surgical history that includes Appendectomy; Esophagogastroduodenoscopy (N/A, 01/20/2015); and Colonoscopy (N/A, 01/20/2015). Prior to Admission medications   Medication Sig Start Date End Date Taking? Authorizing Provider  carvedilol (COREG) 12.5 MG tablet Take 12.5 mg by mouth 2 (two) times daily with a meal.   Yes Historical Provider, MD  cholecalciferol (VITAMIN D) 1000 UNITS tablet Take 2,000 Units by mouth daily.   Yes Historical Provider, MD  furosemide (LASIX) 20 MG tablet Take 20 mg by mouth daily.   Yes Historical Provider, MD  iron polysaccharides (NIFEREX) 150 MG capsule Take 1 capsule (150 mg total) by mouth daily. January 31, 2015  Yes Shanker Levora Dredge, MD  pantoprazole (PROTONIX) 40 MG tablet Take 1 tablet (40 mg total) by mouth daily. 01/31/15  Yes Shanker Levora Dredge, MD  potassium chloride SA (K-DUR,KLOR-CON) 20 MEQ tablet Take 20 mEq by mouth daily.   Yes Historical Provider, MD   Allergies  Allergen Reactions  . Benazepril Hcl     REACTION: angioedema  . Latex Itching and Rash    FAMILY HISTORY:  Family History  Problem Relation Age of Onset  . Alcohol abuse Mother   . Cirrhosis Mother   . Hypertension Mother   . Hypertension Father   . Colon cancer Maternal Uncle   . Colon cancer Maternal Uncle     SOCIAL HISTORY:  reports that she has been smoking Cigarettes.  She  has a 14.5 pack-year smoking history. She has never used smokeless tobacco. She reports that she drinks alcohol. She reports that she does not use illicit drugs.  REVIEW OF SYSTEMS:  Unable to obtain as pt is intubated.  SUBJECTIVE: unresponsive  VITAL SIGNS: Temp:  [93.7 F (34.3 C)-99.2 F (37.3 C)] 93.7 F (34.3 C) (02/23 2215) Pulse Rate:  [72-86] 72 (02/23 2127) Resp:  [16-60] 16 (02/23 2215) BP: (104-147)/(73-100) 135/100 mmHg (02/23 2215) SpO2:  [100 %] 100 % (02/23 2127) FiO2 (%):  [50 %] 50 % (02/23  2127) Weight:  [110.587 kg (243 lb 12.8 oz)] 110.587 kg (243 lb 12.8 oz) (02/23 0619) HEMODYNAMICS:   VENTILATOR SETTINGS: Vent Mode:  [-] PRVC FiO2 (%):  [50 %] 50 % Set Rate:  [16 bmp] 16 bmp Vt Set:  [500 mL] 500 mL PEEP:  [5 cmH20] 5 cmH20 INTAKE / OUTPUT: Intake/Output      02/23 0701 - 02/24 0700   I.V. 1000   Total Intake 1000   Net +1000         PHYSICAL EXAMINATION: General: WDWN female, in NAD. Neuro: Non-responsive, no corneal reflex, no dolls eye., no response to pain stimuli HEENT: [place]/AT. PERRL, sclerae anicteric. Cardiovascular: RRR, no M/R/G.  Lungs: Respirations shallow and unlabored.  Coarse crackles bilaterally. Abdomen: BS absent, soft, NT/ND.  Musculoskeletal: No gross deformities, no edema.  Skin: Intact, warm, no rashes.  LABS:  CBC  Recent Labs Lab 01/20/15 0833 2015/01/31 0820 01/31/15 2108 2015-01-31 2124  WBC 7.0 7.9 12.1*  --   HGB 7.9* 7.9* 8.1* 10.5*  HCT 25.7* 26.2* 27.3* 31.0*  PLT 132* 135* 103*  --    Coag's  Recent Labs Lab 01/17/15 1908 2015/01/31 2108  APTT 40* 38*  INR 1.12 1.39   BMET  Recent Labs Lab 01/18/15 2031 January 31, 2015 0820 01-31-2015 2108 01/31/2015 2124  NA 132* 137 135 136  K 3.2* 3.8 4.0 4.5  CL 99 108 105 104  CO2 25 23 17*  --   BUN CREATININE 1.08 0.87 1.20* 0.90  GLUCOSE 122* 100* 169* 165*   Electrolytes  Recent Labs Lab 01/18/15 2031 31-Jan-2015 0820 2015-01-31 2108  CALCIUM 8.6 9.1 8.6  MG 2.1  --   --    Sepsis Markers  Recent Labs Lab 31-Jan-2015 2117  LATICACIDVEN 7.95*   ABG  Recent Labs Lab 2015/01/31 2125  PHART 7.204*  PCO2ART 45.7*  PO2ART 418.0*   Liver Enzymes  Recent Labs Lab 01/17/15 1908 01/18/15 0952 01/18/15 2031  AST 71* 73* 36  ALT 48* 46* 37*  ALKPHOS 100 105 93  BILITOT 0.4 1.8* 0.7  ALBUMIN 3.4* 3.3* 3.0*   Cardiac Enzymes  Recent Labs Lab 31-Jan-2015 2119  TROPONINI <0.03   Glucose  Recent Labs Lab 01/20/15 1203 01/20/15 1657  01/20/15 2051 01/31/15 0023 01/31/15 0826 01/31/2015 2218  GLUCAP 92 94 119* 118* 100* 98    Imaging Dg Chest Port 1 View  31-Jan-2015   CLINICAL DATA:  Endotracheal and enteric tube placements.  EXAM: PORTABLE CHEST - 1 VIEW  COMPARISON:  01/17/2015  FINDINGS: Of endotracheal tube is been placed with tip measuring 5.4 cm above the carinal. An enteric tube was placed with tip off the field of view but below the left hemidiaphragm. Shallow inspiration. Cardiac enlargement without significant pulmonary vascular congestion. No focal airspace consolidation in the lungs. No blunting of costophrenic angles. No pneumothorax.  IMPRESSION: Appliances appear in satisfactory position. Cardiac enlargement. No  focal consolidation in the lungs appear   Electronically Signed   By: Burman Nieves M.D.   On: 01/14/2015 21:44    ASSESSMENT / PLAN:  PULMONARY OETT 2/23 >>> A: VDRF after PEA arrest Tobacco use disorder Uncompensated acidosis P:   Full mechanical support, increase MV, rate pao2 reviewed to 40% peep 5 VAP bundle ABG and CXR in AM. CT chest  For PE Tobacco cessation.  CARDIOVASCULAR CVL L IJ 2/23 >>> A:  PEA arrest - unclear etiology.  Prolonged downtime, roughly 40 - 50 minutes prior to ROSC R/o dissection - widened mediastinum on CXR Hx diastolic heart failure (Echo from 2/21 showed EF 55-60%), HTN P:  Not hypothermia candidate given prolonged downtime and recent GI bleed. Ensure normothermia / prevent hyperthermia. Levophed PRN. Goal MAP > 70, on own successful Goal CVP 8 - 12, will place line Trend troponin / lactate. CTA chest, TTE. Hold outpatient carvedilol, furosemide, potassium.  RENAL A:   No acute issues P:   NS @ 125. BMP in AM.  GASTROINTESTINAL A:  Recent GI bleed source unclear GI prophylaxis Nutrition P:   SUP: Pantoprazole. NPO. 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 H/H q6hrs to ensure no persistent / occult bleeding. CBC in AM.  INFECTIOUS A:   No indication of infection P:   Monitor clinically.  ENDOCRINE A:   Hyperglycemia - no hx DM P:   CBG's q4hr. SSI.  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 Allow fent to dc and follow repeat neuro examination, if brain death noted in am - apnea testing  Family updated: Daughter, grandson, boyfriend at bedside.  Interdisciplinary Family Meeting v Palliative Care Meeting:  Due by: 2/29.   Rutherford Guys, [place] [facility] Pager: [phone]  or [phone] 01/24/2015, 10:32 PM  STAFF NOTE: I, Rory Percy, MD FACP have personally reviewed patient's available data, including medical history, events of note, physical examination and test results as part of my evaluation. I have discussed with resident/NP and other care providers such as pharmacist, RN and RRT. In addition, I personally evaluated patient and elicited key findings of: unclear etiology arrest, med wide - CT, assess angio chest, assess echo, BP OK for now, NOT a candidate for Temp management, avoid fevers overall, consider line placement, ct head, dc fent, exam now c/w brain death but was on fent drip for a short period of time, increase rate vent, poor prognosis The patient is critically ill with multiple organ systems failure and requires high complexity decision making for assessment and support, frequent evaluation and titration of therapies, application of advanced monitoring technologies and extensive  interpretation of multiple databases.   Critical Care Time devoted to patient care services described in this note is40  Minutes. This time reflects time of care of this signee: Rory Percy, MD FACP. This critical care time does not reflect procedure time, or teaching time or supervisory time of PA/NP/Med student/Med Resident etc but could involve care discussion time.  Rest per NP/medical resident whose note is outlined above and that I agree with   Mcarthur Rossettianiel J. Tyson AliasFeinstein, MD, FACP Pgr: [phone] [place] [facility] 01/22/2015 12:40 AM

## 2015-01-21 NOTE — ED Notes (Signed)
Family at bedside. 

## 2015-01-22 ENCOUNTER — Encounter (HOSPITAL_COMMUNITY): Payer: Self-pay | Admitting: Family Medicine

## 2015-01-22 ENCOUNTER — Inpatient Hospital Stay (HOSPITAL_COMMUNITY): Payer: Medicaid Other

## 2015-01-22 DIAGNOSIS — G931 Anoxic brain damage, not elsewhere classified: Secondary | ICD-10-CM | POA: Diagnosis present

## 2015-01-22 DIAGNOSIS — J9601 Acute respiratory failure with hypoxia: Secondary | ICD-10-CM | POA: Diagnosis present

## 2015-01-22 LAB — POCT I-STAT 3, ART BLOOD GAS (G3+)
Acid-base deficit: 2 mmol/L (ref 0.0–2.0)
Bicarbonate: 24.5 mEq/L — ABNORMAL HIGH (ref 20.0–24.0)
O2 Saturation: 100 %
TCO2: 26 mmol/L (ref 0–100)
pCO2 arterial: 50.9 mmHg — ABNORMAL HIGH (ref 35.0–45.0)
pH, Arterial: 7.288 — ABNORMAL LOW (ref 7.350–7.450)
pO2, Arterial: 359 mmHg — ABNORMAL HIGH (ref 80.0–100.0)

## 2015-01-22 LAB — CBC
HCT: 26.1 % — ABNORMAL LOW (ref 36.0–46.0)
HEMATOCRIT: 27.6 % — AB (ref 36.0–46.0)
HEMOGLOBIN: 7.6 g/dL — AB (ref 12.0–15.0)
Hemoglobin: 8.2 g/dL — ABNORMAL LOW (ref 12.0–15.0)
MCH: 22.9 pg — AB (ref 26.0–34.0)
MCH: 22.6 pg — AB (ref 26.0–34.0)
MCHC: 29.7 g/dL — AB (ref 30.0–36.0)
MCHC: 29.1 g/dL — ABNORMAL LOW (ref 30.0–36.0)
MCV: 77.1 fL — ABNORMAL LOW (ref 78.0–100.0)
MCV: 77.4 fL — ABNORMAL LOW (ref 78.0–100.0)
PLATELETS: 140 10*3/uL — AB (ref 150–400)
Platelets: 112 10*3/uL — ABNORMAL LOW (ref 150–400)
RBC: 3.58 MIL/uL — AB (ref 3.87–5.11)
RBC: 3.37 MIL/uL — ABNORMAL LOW (ref 3.87–5.11)
RDW: 20.2 % — ABNORMAL HIGH (ref 11.5–15.5)
RDW: 20.5 % — ABNORMAL HIGH (ref 11.5–15.5)
WBC: 10.9 10*3/uL — ABNORMAL HIGH (ref 4.0–10.5)
WBC: 10.7 10*3/uL — ABNORMAL HIGH (ref 4.0–10.5)

## 2015-01-22 LAB — HEMOGLOBIN AND HEMATOCRIT, BLOOD
HCT: 27.2 % — ABNORMAL LOW (ref 36.0–46.0)
HCT: 24.7 % — ABNORMAL LOW (ref 36.0–46.0)
HEMOGLOBIN: 7.3 g/dL — AB (ref 12.0–15.0)
Hemoglobin: 8 g/dL — ABNORMAL LOW (ref 12.0–15.0)

## 2015-01-22 LAB — BASIC METABOLIC PANEL
Anion gap: 5 (ref 5–15)
Anion gap: 11 (ref 5–15)
BUN: 10 mg/dL (ref 6–23)
BUN: 10 mg/dL (ref 6–23)
CHLORIDE: 110 mmol/L (ref 96–112)
CO2: 19 mmol/L (ref 19–32)
CO2: 19 mmol/L (ref 19–32)
CREATININE: 1.09 mg/dL (ref 0.50–1.10)
Calcium: 8 mg/dL — ABNORMAL LOW (ref 8.4–10.5)
Calcium: 9.8 mg/dL (ref 8.4–10.5)
Chloride: 109 mmol/L (ref 96–112)
Creatinine, Ser: 1.34 mg/dL — ABNORMAL HIGH (ref 0.50–1.10)
GFR calc Af Amer: 66 mL/min — ABNORMAL LOW (ref >90)
GFR calc Af Amer: 51 mL/min — ABNORMAL LOW (ref >90)
GFR calc non Af Amer: 44 mL/min — ABNORMAL LOW (ref >90)
GFR, EST NON AFRICAN AMERICAN: 57 mL/min — AB (ref >90)
GLUCOSE: 97 mg/dL (ref 70–99)
Glucose, Bld: 157 mg/dL — ABNORMAL HIGH (ref 70–99)
POTASSIUM: 3.5 mmol/L (ref 3.5–5.1)
Potassium: 4.6 mmol/L (ref 3.5–5.1)
Sodium: 133 mmol/L — ABNORMAL LOW (ref 135–145)
Sodium: 140 mmol/L (ref 135–145)

## 2015-01-22 LAB — GLUCOSE, CAPILLARY
GLUCOSE-CAPILLARY: 154 mg/dL — AB (ref 70–99)
GLUCOSE-CAPILLARY: 44 mg/dL — AB (ref 70–99)
Glucose-Capillary: 176 mg/dL — ABNORMAL HIGH (ref 70–99)
Glucose-Capillary: 107 mg/dL — ABNORMAL HIGH (ref 70–99)
Glucose-Capillary: 132 mg/dL — ABNORMAL HIGH (ref 70–99)

## 2015-01-22 LAB — TROPONIN I
Troponin I: <0.03 ng/mL (ref <0.031)
Troponin I: <0.03 ng/mL (ref <0.031)

## 2015-01-22 LAB — MAGNESIUM
MAGNESIUM: 2.1 mg/dL (ref 1.5–2.5)
Magnesium: 2.1 mg/dL (ref 1.5–2.5)

## 2015-01-22 LAB — LACTIC ACID, PLASMA
LACTIC ACID, VENOUS: 1 mmol/L (ref 0.5–2.0)
LACTIC ACID, VENOUS: 3.2 mmol/L — AB (ref 0.5–2.0)

## 2015-01-22 LAB — MRSA PCR SCREENING: MRSA BY PCR: NEGATIVE

## 2015-01-22 LAB — TSH: TSH: 3.855 u[IU]/mL (ref 0.350–4.500)

## 2015-01-22 LAB — PHOSPHORUS: Phosphorus: 3.7 mg/dL (ref 2.3–4.6)

## 2015-01-22 MED ORDER — SODIUM CHLORIDE 0.9 % IV SOLN: INTRAVENOUS | Status: DC

## 2015-01-22 MED ORDER — LORAZEPAM 2 MG/ML IJ SOLN: 2.0000 mg | Freq: Once | INTRAMUSCULAR | Status: AC

## 2015-01-22 MED ORDER — DEXTROSE 5 % IV SOLN
500.0000 mg | Freq: Three times a day (TID) | INTRAVENOUS | Status: DC
  Administered 2015-01-22 – 2015-01-23 (×4): 500 mg via INTRAVENOUS
  Filled 2015-01-22 (×6): qty 5

## 2015-01-22 MED ORDER — SODIUM CHLORIDE 0.9 % IV SOLN
25.0000 ug/h | INTRAVENOUS | Status: DC
  Administered 2015-01-22: 25 ug/h via INTRAVENOUS
  Administered 2015-01-23: 200 ug/h via INTRAVENOUS
  Filled 2015-01-22 (×3): qty 50

## 2015-01-22 MED ORDER — PRO-STAT SUGAR FREE PO LIQD
30.0000 mL | Freq: Every day | ORAL | Status: DC
  Filled 2015-01-22 (×5): qty 30

## 2015-01-22 MED ORDER — DEXTROSE 50 % IV SOLN: 1.0000 | Freq: Once | INTRAVENOUS | Status: AC

## 2015-01-22 MED ORDER — VITAL HIGH PROTEIN PO LIQD
1000.0000 mL | ORAL | Status: DC
  Filled 2015-01-22 (×2): qty 1000

## 2015-01-22 MED ORDER — ADULT MULTIVITAMIN W/MINERALS CH
1.0000 | ORAL_TABLET | Freq: Every day | ORAL | Status: DC
  Filled 2015-01-22: qty 1

## 2015-01-22 MED ORDER — HYDRALAZINE HCL 20 MG/ML IJ SOLN
10.0000 mg | INTRAMUSCULAR | Status: DC | PRN
  Administered 2015-01-22 (×2): 20 mg via INTRAVENOUS
  Administered 2015-01-22: 40 mg via INTRAVENOUS
  Administered 2015-01-23: 20 mg via INTRAVENOUS
  Filled 2015-01-22: qty 1
  Filled 2015-01-22: qty 2
  Filled 2015-01-22 (×2): qty 1

## 2015-01-22 MED ORDER — VALPROATE SODIUM 500 MG/5ML IV SOLN
500.0000 mg | Freq: Three times a day (TID) | INTRAVENOUS | Status: DC
  Filled 2015-01-22 (×2): qty 5

## 2015-01-22 MED ORDER — DEXTROSE 50 % IV SOLN
INTRAVENOUS | Status: AC
  Administered 2015-01-22: 50 mL
  Filled 2015-01-22: qty 50

## 2015-01-22 MED ORDER — LORAZEPAM 2 MG/ML IJ SOLN
INTRAMUSCULAR | Status: AC
  Administered 2015-01-22: 2 mg
  Filled 2015-01-22: qty 1

## 2015-01-22 MED ORDER — LORAZEPAM 2 MG/ML IJ SOLN
2.0000 mg | INTRAMUSCULAR | Status: DC | PRN
  Administered 2015-01-22 – 2015-01-23 (×7): 2 mg via INTRAVENOUS
  Filled 2015-01-22 (×7): qty 1

## 2015-01-22 MED FILL — Medication: Qty: 1 | Status: AC

## 2015-01-22 NOTE — Progress Notes (Signed)
INITIAL NUTRITION ASSESSMENT  DOCUMENTATION CODES Per approved criteria  -Morbid Obesity   INTERVENTION: Initiate Vital High Protein @ 30 ml/hr via OG tube   30 ml Prostat daily.   MVI daily.    Tube feeding regimen provides 1220 kcal (64% of needs), 138 grams of protein, and 601 ml of H2O.   NUTRITION DIAGNOSIS: Inadequate oral intake related to inability to eat as evidenced by NPO status  Goal: Enteral nutrition to provide 60-70% of estimated calorie needs (22-25 kcals/kg ideal body weight) and 100% of estimated protein needs, based on ASPEN guidelines for hypocaloric, high protein feeding in critically ill obese individuals  Monitor:  Vent status, plan of care, TF tolerance/adequacy, labs  Reason for Assessment: Consult received to initiate and manage enteral nutrition support.  54 y.o. female  Admitting Dx: PEA Arrest  ASSESSMENT: Pt d/c'ed 2/23 after being admitted for acute blood loss anemia but returned same day after found down by boyfriend. Pt in PEA arrest on EMS arrival and suffered second PEA arrest in ED.   Patient is currently intubated on ventilator support MV: 12.1 L/min Temp (24hrs), Avg:94.4 F (34.7 C), Min:92.7 F (33.7 C), Max:97.7 F (36.5 C)  Pt discussed during ICU rounds and with RN.  No signs of fat or muscle depletion noted on exam.  Pt to start EEG to rule out seizures.  Sodium low, Magnesium and Phosphorus WNL, CBG's: 107-176 Pt with hx of ETOH per RN. Per last H&P pt drinks 40 oz of malt liquor 1-2 daily   Height: Ht Readings from Last 1 Encounters:  01/22/15 5\' 4"  (1.626 m)    Weight: Wt Readings from Last 1 Encounters:  01/22/15 246 lb 7.6 oz (111.8 kg)    Ideal Body Weight: 54.5 kg   % Ideal Body Weight: 205%  Wt Readings from Last 10 Encounters:  01/22/15 246 lb 7.6 oz (111.8 kg)  01/11/2015 243 lb 12.8 oz (110.587 kg)  05/13/10 218 lb (98.884 kg)  02/13/10 218 lb 14.4 oz (99.292 kg)  05/20/09 218 lb (98.884 kg)   12/08/07 223 lb (101.152 kg)    Usual Body Weight: 240's   % Usual Body Weight: 100%  BMI:  Body mass index is 42.29 kg/(m^2).  Estimated Nutritional Needs: Kcal: 1901 Protein: >/= 136 grams Fluid: > 1.9 L/day  Skin: WDL   Diet Order:    EDUCATION NEEDS: -No education needs identified at this time   Intake/Output Summary (Last 24 hours) at 01/22/15 1017 Last data filed at 01/22/15 0900  Gross per 24 hour  Intake 1231.67 ml  Output    805 ml  Net 426.67 ml    Last BM: 2/22   Labs:   Recent Labs Lab 01/18/15 2031  01/03/2015 2108 01/20/2015 2124 01/24/2015 2307 01/22/15 0347  NA 132*  < > 135 136 137 133*  K 3.2*  < > 4.0 4.5 4.3 3.5  CL 99  < > 105 104 109 109  CO2 25  < > 17*  --  18* 19  BUN 6  < > 7 9 7 10   CREATININE 1.08  < > 1.20* 0.90 1.00 1.09  CALCIUM 8.6  < > 8.6  --  8.5 8.0*  MG 2.1  --   --   --   --  2.1  PHOS  --   --   --   --   --  3.7  GLUCOSE 122*  < > 169* 165* 112* 157*  < > = values in  this interval not displayed.  CBG (last 3)   Recent Labs  01/22/15 0027 01/22/15 0314 01/22/15 0721  GLUCAP 154* 176* 107*    Scheduled Meds: . antiseptic oral rinse  7 mL Mouth Rinse QID  . chlorhexidine  15 mL Mouth Rinse BID  . folic acid  1 mg Intravenous Daily  . heparin  5,000 Units Subcutaneous 3 times per day  . insulin aspart  0-15 Units Subcutaneous 6 times per day  . pantoprazole (PROTONIX) IV  40 mg Intravenous QHS  . thiamine  100 mg Intravenous Daily  . valproate sodium  500 mg Intravenous 3 times per day    Continuous Infusions: . sodium chloride 10 mL/hr at 01/22/15 0700  . norepinephrine (LEVOPHED) Adult infusion      Past Medical History  Diagnosis Date  . Hypertension   . CHF (congestive heart failure)     Diagnosed in 08/01/2012    Past Surgical History  Procedure Laterality Date  . Appendectomy    . Esophagogastroduodenoscopy N/A 01/20/2015    Procedure: ESOPHAGOGASTRODUODENOSCOPY (EGD);  Surgeon: Willis Modena, MD;  Location: [facility] ENDOSCOPY;  Service: Endoscopy;  Laterality: N/A;  . Colonoscopy N/A 01/20/2015    Procedure: COLONOSCOPY;  Surgeon: Willis Modena, MD;  Location: [facility] ENDOSCOPY;  Service: Endoscopy;  Laterality: N/A;    Kendell Bane RD, LDN, CNSC [phone] Pager [phone] After Hours Pager

## 2015-01-22 NOTE — Progress Notes (Signed)
   01/22/15 1200  Clinical Encounter Type  Visited With Patient and family together;Health care provider  Visit Type Follow-up;Spiritual support;Social support  Spiritual Encounters  Spiritual Needs Emotional  Stress Factors  Patient Stress Factors Health changes  Family Stress Factors Health changes   Chaplain was paged later in the morning and notified that family was present and were updated by a critical care doctor regarding the patient coding this morning. A chaplain visit was recommended by patient's nurse since the family had been teary following the prognosis of the patient. Chaplain was able to visit with the patient's family early this afternoon. Patient's grandson was alone at bedside and the rest of the family was in the waiting room. Patient's grandson said it has been difficult but he just hopes that the patient is kept comfortable. Patient's grandson explained that his mother, the patient's daughter, is pregnant at the moment so everyone is trying to keep her calm and taking care of herself. Patient's grandson said that the patient has been struggling with heart complications for the past several months. Patient's grandson also recognized chaplain as the same chaplain who provided care for their family as his aunt passed back in November. Patient's family is really having a difficult time, especially since they have experienced loss in their family last November. Chaplain checked in with the family in the waiting room and they are supporting each other and waiting to be at bedside themselves. Chaplain will continue to provide emotional and spiritual support for patient and patient's family as needed. Kinesha Auten, Tommi EmeryBlake R, Chaplain  1:08 PM

## 2015-01-22 NOTE — Progress Notes (Signed)
EEG tech in room setting up EEG.  Patient went asystole.  See code notes/sheet. PenningtonMilford, Mitzi HansenJessica Marie

## 2015-01-22 NOTE — Progress Notes (Signed)
eLink Physician-Brief Progress Note Patient Name: Patricia Mejia DOB: 09/28/1961 MRN: [id number]   Date of Service  01/22/2015  HPI/Events of Note  Severe hypertension  eICU Interventions  DC NS infusion PRN hydralazine to maintain SBP < 170 mmHg     Intervention Category Major Interventions: Hypertension - evaluation and management  Billy FischerDavid Simonds 01/22/2015, 1:04 AM

## 2015-01-22 NOTE — Progress Notes (Signed)
   01/22/15 0900  Clinical Encounter Type  Visited With Patient  Visit Type Follow-up;Code;Critical Care   Chaplain responded to a Code Blue page for the patient. Medical team was working with patient when chaplain arrived. Family was not present to chaplain's knowledge. Nurse indicated they were waiting on a critical care physician to update family. Page Merrilyn Puman-Call chaplain if support for family is needed. Cranston NeighborStrother, Yakov Bergen R, Chaplain  9:31 AM

## 2015-01-22 NOTE — Progress Notes (Signed)
EEG completed; results pending.    

## 2015-01-22 NOTE — Progress Notes (Signed)
PULMONARY / CRITICAL CARE MEDICINE   Name: Patricia Mejia MRN: [id number] DOB: 1961-04-01    ADMISSION DATE:  01/03/2015 CONSULTATION DATE:  01/22/2015  REFERRING MD :  EDP  CHIEF COMPLAINT:  PEA Arrest  INITIAL PRESENTATION:  54 y.o. F brought to [facility] ED 2/23 in PEA arrest.  ROSC roughly 40 - 50 minutes.  Discharged earlier same day after being admitted for acute blood loss anemia.  STUDIES:  Colonoscopy 2/22 >>> external hemorrhoids, 66m cecal and 6540msigmoid polyps removed with hot snare. 40m21mectal polyp removed and biopsied.  No diverticula.  Shallow well defined ulceration along IC valve, biopsies obtained. CXR 2/22 >>> widened mediastinum CTA chest 2/22 >>> no PE, tracheal debris into R main and RLL airway CT head 2/22 >>> some subtle loss of the gray-white junction   SIGNIFICANT EVENTS: 2/23 - discharged 2/23 - admitted after PEA arrest, started on normothermia protocol (36 degrees)  SUBJECTIVE: Called this am and notified that pt went brady to asystole at 9:02, pulseless for 5 minutes, received epi + CPR + bicarb x 1. Currently hemodynamically stable with evidence myoclonus vs full seizures  VITAL SIGNS: Temp:  [92.7 F (33.7 C)-97.4 F (36.3 C)] 96.1 F (35.6 C) (02/24 0800) Pulse Rate:  [38-109] 92 (02/24 0815) Resp:  [16-60] 17 (02/24 0815) BP: (95-221)/(55-165) 95/59 mmHg (02/24 0815) SpO2:  [98 %-100 %] 100 % (02/24 0815) FiO2 (%):  [50 %] 50 % (02/24 0800) Weight:  [111.8 kg (246 lb 7.6 oz)] 111.8 kg (246 lb 7.6 oz) (02/24 0000) HEMODYNAMICS:   VENTILATOR SETTINGS: Vent Mode:  [-] PRVC FiO2 (%):  [50 %] 50 % Set Rate:  [16 bmp-24 bmp] 24 bmp Vt Set:  [414 mL-500 mL] 414 mL PEEP:  [5 cmH20] 5 cmH20 Plateau Pressure:  [18 cmH20-41 cmH20] 18 cmH20 INTAKE / OUTPUT: Intake/Output      02/23 0701 - 02/24 0700 02/24 0701 - 02/25 0700   I.V. (mL/kg) 1156.7 (10.3) 10 (0.1)   IV Piggyback 55    Total Intake(mL/kg) 1211.7 (10.8) 10 (0.1)   Urine (mL/kg/hr) 765 40  (0.1)   Total Output 765 40   Net +446.7 -30          PHYSICAL EXAMINATION: General: obese woman, ill appearing Neuro: Non-responsive, no corneal reflex, no dolls eye., no response to pain stimuli, frequent myoclonus of B UE, neck, LE HEENT: [place]/AT. PERRL, sclerae anicteric. Cardiovascular: RRR, no M/R/G.  Lungs: ventilated, coarse BS bilaterally Abdomen: BS absent, soft, NT/ND.  Musculoskeletal: No gross deformities, no edema.  Skin: Intact, warm, no rashes.  LABS:  CBC  Recent Labs Lab 01/26/2015 2108  01/06/2015 2307 01/22/15 0045 01/22/15 0347  WBC 12.1*  --  12.3*  --  10.9*  HGB 8.1*  < > 7.9* 8.0* 8.2*  HCT 27.3*  < > 26.7* 27.2* 27.6*  PLT 103*  --  94*  --  112*  < > = values in this interval not displayed. Coag's  Recent Labs Lab 01/17/15 1908 01/02/2015 2108 01/20/2015 2307  APTT 40* 38* 36  INR 1.12 1.39 1.36   BMET  Recent Labs Lab 01/09/2015 2108 01/17/2015 2124 01/11/2015 2307 01/22/15 0347  NA 135 136 137 133*  K 4.0 4.5 4.3 3.5  CL 105 104 109 109  CO2 17*  --  18* 19  BUN _0 CREATININE 1.20* 0.90 1.00 1.09  GLUCOSE 169* 165* 112* 157*   Electrolytes  Recent Labs Lab 01/18/15 2031  01/16/2015  2108 01/03/2015 2307 01/22/15 0347  CALCIUM 8.6  < > 8.6 8.5 8.0*  MG 2.1  --   --   --  2.1  PHOS  --   --   --   --  3.7  < > = values in this interval not displayed. Sepsis Markers  Recent Labs Lab 12/31/2014 2117 01/22/15 0045 01/22/15 0347  LATICACIDVEN 7.95* 3.2* 1.0   ABG  Recent Labs Lab 01/19/2015 2125  PHART 7.204*  PCO2ART 45.7*  PO2ART 418.0*   Liver Enzymes  Recent Labs Lab 01/17/15 1908 01/18/15 0952 01/18/15 2031  AST 71* 73* 36  ALT 48* 46* 37*  ALKPHOS 100 105 93  BILITOT 0.4 1.8* 0.7  ALBUMIN 3.4* 3.3* 3.0*   Cardiac Enzymes  Recent Labs Lab 01/22/2015 2119 01/05/2015 2307 01/22/15 0347  TROPONINI <0.03 <0.03 <0.03   Glucose  Recent Labs Lab 01/22/2015 0023 01/19/2015 0826 01/18/2015 2218 01/22/15 0027  01/22/15 0314 01/22/15 0721  GLUCAP 118* 100* 98 154* 176* 107*    Imaging Ct Head Wo Contrast  01/14/2015   CLINICAL DATA:  Cardiac arrest.  Patient was down for almost 1 hr.  EXAM: CT HEAD WITHOUT CONTRAST  TECHNIQUE: Contiguous axial images were obtained from the base of the skull through the vertex without intravenous contrast.  COMPARISON:  None.  FINDINGS: Mild diffuse cerebral atrophy. Mild ventricular dilatation consistent with central atrophy. No mass effect or midline shift. No abnormal extra-axial fluid collections. Gray-white matter junctions are visible with possible mild loss of distinction. No focal low-attenuation changes demonstrated. No evidence of acute intracranial hemorrhage. Basal cisterns are not effaced. Calvarium appears intact. Opacification of ethmoid air cells and nasal passages with nasal cannula in place. Mastoid air cells are not opacified.  IMPRESSION: No acute intracranial hemorrhage or mass effect. Gray-white matter junctions are visible but may be somewhat indistinct. Early edematous change not excluded. Follow-up suggested as clinically indicated.   Electronically Signed   By: Lucienne Capers M.D.   On: 01/17/2015 23:41   Ct Angio Chest Pe W/cm &/or Wo Cm  01/07/2015   CLINICAL DATA:  Cardiac arrest.  EXAM: CT ANGIOGRAPHY CHEST WITH CONTRAST  TECHNIQUE: Multidetector CT imaging of the chest was performed using the standard protocol during bolus administration of intravenous contrast. Multiplanar CT image reconstructions and MIPs were obtained to evaluate the vascular anatomy.  CONTRAST:  169m OMNIPAQUE IOHEXOL 350 MG/ML SOLN  COMPARISON:  Chest radiograph earlier this day.  FINDINGS: There are no filling defects within the pulmonary arteries to suggest pulmonary embolus.  Endotracheal tube in the midtrachea. Enteric tube in place, tip and side port in the stomach.  There is multi chamber cardiomegaly. Contrast refluxes into the IVC and hepatic veins. There is  aneurysmal dilatation of the ascending aorta up to 3.8 cm. There is mild mediastinal adenopathy. For example a right paratracheal lymph node measures 15 mm in short axis dimension. Prominent prevascular nodes measuring up to 7 mm short axis dimension. Prominent right hilar lymph node measures 13 mm. There is a small right pleural effusion.  Mild dependent debris in the trachea just distal to the endotracheal tube and in the right mainstem bronchus. There is debris in the right lower lobe bronchus causing right lower lobe atelectasis. Mild dependent atelectasis at the left lung base. No pericardial effusion.  Evaluation of the upper abdomen demonstrates contrast refluxing into the IVC and hepatic veins. No additional acute abnormality.  Probable nondisplaced sternal body fracture. No definite rib fracture. There is  degenerative change in the spine.  Review of the MIP images confirms the above findings.  IMPRESSION: 1. No pulmonary embolus. 2. Debris within the trachea, right mainstem and right lower lobe bronchus with associated right basilar atelectasis, question aspiration. Small associated right pleural effusion. Minimal atelectasis at the left lung base. 3. Cardiomegaly with contrast refluxing into the IVC and hepatic veins, consistent with right heart failure. 4. Probable nondisplaced sternal body fracture, acuity is uncertain, may be acute.   Electronically Signed   By: Jeb Levering M.D.   On: 01/25/2015 23:48   Dg Chest Port 1 View  01/01/2015   CLINICAL DATA:  Endotracheal and enteric tube placements.  EXAM: PORTABLE CHEST - 1 VIEW  COMPARISON:  01/17/2015  FINDINGS: Of endotracheal tube is been placed with tip measuring 5.4 cm above the carinal. An enteric tube was placed with tip off the field of view but below the left hemidiaphragm. Shallow inspiration. Cardiac enlargement without significant pulmonary vascular congestion. No focal airspace consolidation in the lungs. No blunting of costophrenic  angles. No pneumothorax.  IMPRESSION: Appliances appear in satisfactory position. Cardiac enlargement. No focal consolidation in the lungs appear   Electronically Signed   By: Lucienne Capers M.D.   On: 01/19/2015 21:44    ASSESSMENT / PLAN:  PULMONARY OETT 2/23 >>> A: VDRF after PEA arrest Tobacco use disorder Acidosis > improved with support  P:   Full mechanical support VAP bundle ABG now Tobacco cessation.  CARDIOVASCULAR CVL L IJ 2/23 >>> A:  PEA arrest - unclear etiology.  Prolonged downtime, roughly 40 - 50 minutes prior to ROSC. Recurrent arrest 2/24, again unclear why decompensation: no dissection or PE, troponin negative Hx diastolic heart failure (Echo from 2/21 showed EF 55-60%), HTN P:  Not hypothermia candidate given prolonged downtime and recent GI bleed. Ensure normothermia / prevent hyperthermia. Levophed PRN > has not required TTE pending Hold outpatient carvedilol, furosemide, potassium.  RENAL A:   No acute issues P:   NS @ 125. BMP in AM.  GASTROINTESTINAL A:  Recent GI bleed source unclear, suspected hemorrhoidal GI prophylaxis Nutrition P:   SUP: Pantoprazole. NPO. 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 H/H q6hrs to ensure no persistent / occult bleeding. Follow CBC  INFECTIOUS A:   Evidence for aspirated debris on Ct chest  P:   Monitor clinically.  ENDOCRINE A:   Hyperglycemia - no hx DM P:   CBG's q4hr. SSI.  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 Ativan and  val;proate for myoclonus / seizures  Family updated: met with pt's daughter, son-in-law, daughter's grandmother 2/24 am. Explained evidence for a severe hypoxemic brain injury, poor prognosis for recovery. They are absorbing this difficult news, supporting each other. I have recommended that she undergo no more CPR and they understand / agree. I also informed them that we will need to discuss withdrawal of care soon. I will meet with them again to start these conversations.   Interdisciplinary Family Meeting v Palliative Care Meeting:  Due by: 2/29.  My independent  CC time 50 minutes  Baltazar Apo, MD, PhD 01/22/2015, 11:16 AM [facility] [phone] or if no answer [phone]

## 2015-01-22 NOTE — Progress Notes (Signed)
MD made aware of worsening myoclonus. No new orders at this time.

## 2015-01-22 NOTE — Progress Notes (Signed)
Hypoglycemic Event  CBG: 44  Treatment: D50 IV 50 mL  Symptoms: None  Follow-up CBG: Time: 1118 CBG Result: 132  Possible Reasons for Event: Unknown  Comments/MD notified: Dr. Delton CoombesByrum made aware    Wende MottMilford, Mitzi HansenJessica Marie  Remember to initiate Hypoglycemia Order Set & complete

## 2015-01-22 NOTE — Code Documentation (Signed)
CODE BLUE NOTE  Patient Name: Patricia Mejia   MRN: [id number]   Date of Birth/ Sex: 04/05/1961 , female      Admission Date: 01/18/2015  Attending Provider: Oretha Milchakesh Alva V, MD  Primary Diagnosis: <principal problem not specified>    Indication: Pt was in her usual state of health until this AM, when she was noted to be in cardiac arrest. Code blue was subsequently called. At the time of arrival on scene, ACLS protocol was underway. Pt. Had already been given epinephrine, and atropine and chest compressions were underway. She was already ventilated mechanically and maintained O2 saturations near 100% throughout. She subsequently was found to have a pulse, and converted to sinus tachycardia. Blood pressures remained stable throughout this episode. Stat labs were ordered including electrolytes, troponin, and lactic acid. She was then noted to have rhythmic myoclonic jerking of her extremities consistent with seizure presentation. Pupils were reactive, and equally round, but patient remained unresponsive. She was given 2mg  of ativan after approximately 3 minutes for seizure control. CCM was primary team. They were notified upon our arrival, and they shortly arrived to take over care.     Technical Description:  - CPR performance duration:  5  minute  - Was defibrillation or cardioversion used? No   - Was external pacer placed? No  - Was patient intubated pre/post CPR? Yes    Medications Administered: Y = Yes; Blank = No Amiodarone    Atropine  Y  Calcium  Y  Epinephrine  Y  Lidocaine    Magnesium    Norepinephrine    Phenylephrine    Sodium bicarbonate  Y  Vasopressin    Ativan    2mg   Post CPR evaluation:  - Final Status - Was patient successfully resuscitated ? Yes - What is current rhythm? NSR - What is current hemodynamic status? Stable   Miscellaneous Information:  - Labs sent, including: Troponin, BMET, CBC, Lactic Acid, Calcium.   - Primary team notified?  Yes  - Family  Notified? Yes  - Additional notes/ transfer status:  CCM is primary and will resume care.         Yolande Jollyaleb G Colette Dicamillo, MD  01/22/2015, 9:19 AM

## 2015-01-22 NOTE — Progress Notes (Signed)
eLink Physician-Brief Progress Note Patient Name: Lorenz CoasterBrenda L Lipsky DOB: 12/11/1960 MRN: [id number]   Date of Service  01/22/2015  HPI/Events of Note  Pt is developing myoclonus  eICU Interventions  Valproic acid ordered     Intervention Category Major Interventions: Other:  Billy FischerDavid Simonds 01/22/2015, 3:04 AM

## 2015-01-22 NOTE — Care Management Note (Signed)
    Page 1 of 1   01/22/2015     8:39:37 AM CARE MANAGEMENT NOTE 01/22/2015  Patient:  Patricia Mejia,Patricia Mejia   Account Number:  [id number]  Date Initiated:  01/22/2015  Documentation initiated by:  Junius CreamerWELL,DEBBIE  Subjective/Objective Assessment:   adm w cardiac arrest-vent     Action/Plan:   lives alone, pcp dr Minerva Areolaeric dean   Anticipated DC Date:  01/25/2015   Anticipated DC Plan:  HOME/SELF CARE         Choice offered to / List presented to:             Status of service:   Medicare Important Message given?   (If response is "NO", the following Medicare IM given date fields will be blank) Date Medicare IM given:   Medicare IM given by:   Date Additional Medicare IM given:   Additional Medicare IM given by:    Discharge Disposition:    Per UR Regulation:  Reviewed for med. necessity/level of care/duration of stay  If discussed at Long Length of Stay Meetings, dates discussed:    Comments:

## 2015-01-22 NOTE — Procedures (Signed)
ELECTROENCEPHALOGRAM REPORT   Patient: Patricia Mejia       Room #: 2H-11 EEG No. ID:  Age: 54 y.o.        Sex: female Referring Physician: Dr Vassie LollAlva Report Date:  01/22/2015        Interpreting Physician: Elspeth ChoSUMNER, Jazyah Butsch, Jill AlexandersJUSTIN  History: Patricia Mejia is an 54 y.o. female with cardiac arrest x 2, initial time >45 minutes until ROSC. Noted myoclonus on exam.   Medications:  Scheduled: . antiseptic oral rinse  7 mL Mouth Rinse QID  . chlorhexidine  15 mL Mouth Rinse BID  . feeding supplement (PRO-STAT SUGAR FREE 64)  30 mL Per Tube 5 X Daily  . feeding supplement (VITAL HIGH PROTEIN)  1,000 mL Per Tube Q24H  . folic acid  1 mg Intravenous Daily  . heparin  5,000 Units Subcutaneous 3 times per day  . insulin aspart  0-15 Units Subcutaneous 6 times per day  . multivitamin with minerals  1 tablet Per Tube Daily  . pantoprazole (PROTONIX) IV  40 mg Intravenous QHS  . thiamine  100 mg Intravenous Daily  . valproate sodium  500 mg Intravenous 3 times per day    Conditions of Recording:  This is a 16 channel EEG carried out with the patient in the unresponsive state.  Description:  Diffuse muscle activity makes this a technically limited study. No posterior dominant rhythm is noted. Frequent myoclonic jerks are noted occuring roughly every 1 to 2 seconds. The majority of these have both an EEG and EKG correlate raising the possibility that they are  muscle/movement artifact. There are frequent myoclonic episodes though that do not have a EKG correlate indicating a potential ictal phenomenon. At times there appears to be a burst suppression pattern with periods of activity followed by suppression of the background.   Hyperventilation and photic stimulation was not performed.    IMPRESSION: Markedly abnormal EEG due to frequent myoclonic activity with suppression of the background rhythm. An ictal component cannot be ruled out as multiple myoclonic episodes have an EEG correlate but no EKG  correlate. In the setting of post-arrest hypoxia these findings indicate a poor prognosis for meaningful neurological recovery.    Elspeth Choeter Airanna Partin, DO [facility] [phone]  If 7pm- 7am, please page neurology on call as listed in AMION. 01/22/2015, 1:00 PM

## 2015-01-22 NOTE — Progress Notes (Signed)
Patient's BP 85/44, Dr. Delton CoombesByrum notified.  Will continue to monitor. [place] Springs[place], Mitzi HansenJessica Marie

## 2015-01-23 ENCOUNTER — Inpatient Hospital Stay: Payer: Self-pay

## 2015-01-23 DIAGNOSIS — J9601 Acute respiratory failure with hypoxia: Secondary | ICD-10-CM

## 2015-01-23 DIAGNOSIS — G931 Anoxic brain damage, not elsewhere classified: Secondary | ICD-10-CM

## 2015-01-23 LAB — CALCIUM, IONIZED: Calcium, Ion: 1.48 mmol/L — ABNORMAL HIGH (ref 1.12–1.32)

## 2015-01-23 LAB — LACTIC ACID, PLASMA: LACTIC ACID, VENOUS: 3.9 mmol/L — AB (ref 0.5–2.0)

## 2015-01-28 ENCOUNTER — Telehealth: Payer: Self-pay

## 2015-01-28 NOTE — Progress Notes (Signed)
Confirmed with patient's daughter Kathrin Ruddy(LaTosha Foerster) that she wishes to continue with terminal extubation/comfort measures only.  Dr. Delton CoombesByrum and respiratory therapy notified.  Will continue to monitor and support patient and family. [place], Mitzi HansenJessica Marie

## 2015-01-28 NOTE — Progress Notes (Signed)
25 cc of Fentanyl wasted in sink witnessed by 2 RN's, Jessica M and Lesley W.

## 2015-01-28 NOTE — Telephone Encounter (Signed)
Received death certificate 1/6/103/1/16 from [facility].  Dr. Delton CoombesByrum not in office.  Will page Dr. Tyson AliasFeinstein to see if he will sign.  Sending d/c to Dr. Tyson AliasFeinstein @ 2100.  Received back 01/30/15 and called for pick-up.

## 2015-01-28 NOTE — Progress Notes (Signed)
PULMONARY / CRITICAL CARE MEDICINE   Name: Patricia Mejia MRN: [id number] DOB: Jul 10, 1961    ADMISSION DATE:  Feb 06, 2015 CONSULTATION DATE:  01/26/2015  REFERRING MD :  EDP  CHIEF COMPLAINT:  PEA Arrest  INITIAL PRESENTATION:  54 y.o. F brought to [facility] ED 2/23 in PEA arrest.  ROSC roughly 40 - 50 minutes.  Discharged earlier same day after being admitted for acute blood loss anemia.  STUDIES:  Colonoscopy 2/22 >>> external hemorrhoids, 8mm cecal and 6mm sigmoid polyps removed with hot snare. 3mm rectal polyp removed and biopsied.  No diverticula.  Shallow well defined ulceration along IC valve, biopsies obtained. CXR 2/22 >>> widened mediastinum CTA chest 2/22 >>> no PE, tracheal debris into R main and RLL airway CT head 2/22 >>> some subtle loss of the gray-white junction   SIGNIFICANT EVENTS: 2/23 - discharged 2/23 - admitted after PEA arrest, started on normothermia protocol (36 degrees)  SUBJECTIVE: Continued decline in BP Good discussions with pt;s family 2/24, have agreed to avoid any escalation in her care. She has clearly declined  VITAL SIGNS: Temp:  [99.5 F (37.5 C)-102.7 F (39.3 C)] 99.7 F (37.6 C) (02/25 1600) Pulse Rate:  [33-95] 81 (02/25 1600) Resp:  [20-32] 28 (02/25 1600) BP: (52-226)/(19-179) 90/44 mmHg (02/25 1400) SpO2:  [63 %-100 %] 63 % (02/25 1600) FiO2 (%):  [60 %-100 %] 60 % (02/25 1153) Weight:  [114.8 kg (253 lb 1.4 oz)] 114.8 kg (253 lb 1.4 oz) (02/25 1400) HEMODYNAMICS:   VENTILATOR SETTINGS: Vent Mode:  [-] PRVC FiO2 (%):  [60 %-100 %] 60 % Set Rate:  [26 bmp] 26 bmp Vt Set:  [430 mL] 430 mL PEEP:  [5 cmH20] 5 cmH20 Plateau Pressure:  [24 cmH20-34 cmH20] 34 cmH20 INTAKE / OUTPUT: Intake/Output      02/24 0701 - 02/25 0700 02/25 0701 - 02/26 0700   P.O.  0   I.V. (mL/kg) 544.2 (4.7) 270 (2.4)   IV Piggyback 165    Total Intake(mL/kg) 709.2 (6.2) 270 (2.4)   Urine (mL/kg/hr) 92 (0) 35 (0)   Total Output 92 35   Net +617.2 +235           PHYSICAL EXAMINATION: General: obese woman, ill appearing Neuro: Non-responsive, no corneal reflex, no dolls eye., no response to pain stimuli, frequent myoclonus of B UE, neck, LE HEENT: [place]/AT. PERRL, sclerae anicteric. Cardiovascular: RRR, no M/R/G.  Lungs: ventilated, coarse BS bilaterally Abdomen: BS absent, soft, NT/ND.  Musculoskeletal: No gross deformities, no edema.  Skin: Intact, warm, no rashes.  LABS:  CBC  Recent Labs Lab 02/06/2015 2307  01/22/15 0347 01/22/15 0912 01/22/15 1107  WBC 12.3*  --  10.9* 10.7*  --   HGB 7.9*  < > 8.2* 7.6* 7.3*  HCT 26.7*  < > 27.6* 26.1* 24.7*  PLT 94*  --  112* 140*  --   < > = values in this interval not displayed. Coag's  Recent Labs Lab 01/17/15 1908 02-06-15 2108 02/06/2015 2307  APTT 40* 38* 36  INR 1.12 1.39 1.36   BMET  Recent Labs Lab 02/06/2015 2307 01/22/15 0347 01/22/15 0912  NA 137 133* 140  K 4.3 3.5 4.6  CL 109 109 110  CO2 18* 19 19  BUN CREATININE 1.00 1.09 1.34*  GLUCOSE 112* 157* 97   Electrolytes  Recent Labs Lab 01/18/15 2031  2015-02-06 2307 01/22/15 0347 01/22/15 0912  CALCIUM 8.6  < > 8.5 8.0* 9.8  MG  2.1  --   --  2.1 2.1  PHOS  --   --   --  3.7  --   < > = values in this interval not displayed. Sepsis Markers  Recent Labs Lab 01/22/15 0045 01/22/15 0347 01/22/15 0912  LATICACIDVEN 3.2* 1.0 3.9*   ABG  Recent Labs Lab 2015-01-27 2125 01/22/15 0951  PHART 7.204* 7.288*  PCO2ART 45.7* 50.9*  PO2ART 418.0* 359.0*   Liver Enzymes  Recent Labs Lab 01/17/15 1908 01/18/15 0952 01/18/15 2031  AST 71* 73* 36  ALT 48* 46* 37*  ALKPHOS 100 105 93  BILITOT 0.4 1.8* 0.7  ALBUMIN 3.4* 3.3* 3.0*   Cardiac Enzymes  Recent Labs Lab 27-Jan-2015 2307 01/22/15 0347 01/22/15 0912  TROPONINI <0.03 <0.03 <0.03   Glucose  Recent Labs Lab Jan 27, 2015 2218 01/22/15 0027 01/22/15 0314 01/22/15 0721 01/22/15 1101 01/22/15 1121  GLUCAP 98 154* 176* 107* 44* 132*     Imaging Ct Head Wo Contrast  01-27-15   CLINICAL DATA:  Cardiac arrest.  Patient was down for almost 1 hr.  EXAM: CT HEAD WITHOUT CONTRAST  TECHNIQUE: Contiguous axial images were obtained from the base of the skull through the vertex without intravenous contrast.  COMPARISON:  None.  FINDINGS: Mild diffuse cerebral atrophy. Mild ventricular dilatation consistent with central atrophy. No mass effect or midline shift. No abnormal extra-axial fluid collections. Gray-white matter junctions are visible with possible mild loss of distinction. No focal low-attenuation changes demonstrated. No evidence of acute intracranial hemorrhage. Basal cisterns are not effaced. Calvarium appears intact. Opacification of ethmoid air cells and nasal passages with nasal cannula in place. Mastoid air cells are not opacified.  IMPRESSION: No acute intracranial hemorrhage or mass effect. Gray-white matter junctions are visible but may be somewhat indistinct. Early edematous change not excluded. Follow-up suggested as clinically indicated.   Electronically Signed   By: Burman Nieves M.D.   On: 01/27/2015 23:41   Ct Angio Chest Pe W/cm &/or Wo Cm  01-27-15   CLINICAL DATA:  Cardiac arrest.  EXAM: CT ANGIOGRAPHY CHEST WITH CONTRAST  TECHNIQUE: Multidetector CT imaging of the chest was performed using the standard protocol during bolus administration of intravenous contrast. Multiplanar CT image reconstructions and MIPs were obtained to evaluate the vascular anatomy.  CONTRAST:  OMNIPAQUE IOHEXOL 350 MG/ML SOLN  COMPARISON:  Chest radiograph earlier this day.  FINDINGS: There are no filling defects within the pulmonary arteries to suggest pulmonary embolus.  Endotracheal tube in the midtrachea. Enteric tube in place, tip and side port in the stomach.  There is multi chamber cardiomegaly. Contrast refluxes into the IVC and hepatic veins. 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 There is mild  mediastinal adenopathy. For example a right paratracheal lymph node measures 15 mm in short axis dimension. Prominent prevascular nodes measuring up to 7 mm short axis dimension. Prominent right hilar lymph node measures 13 mm. There is a small right pleural effusion.  Mild dependent debris in the trachea just distal to the endotracheal tube and in the right mainstem bronchus. There is debris in the right lower lobe bronchus causing right lower lobe atelectasis. Mild dependent atelectasis at the left lung base. No pericardial effusion.  Evaluation of the upper abdomen demonstrates contrast refluxing into the IVC and hepatic veins. No additional acute abnormality.  Probable nondisplaced sternal body fracture. No definite rib fracture. There is degenerative change in the spine.  Review of the MIP images confirms the  above findings.  IMPRESSION: 1. No pulmonary embolus. 2. Debris within the trachea, right mainstem and right lower lobe bronchus with associated right basilar atelectasis, question aspiration. Small associated right pleural effusion. Minimal atelectasis at the left lung base. 3. Cardiomegaly with contrast refluxing into the IVC and hepatic veins, consistent with right heart failure. 4. Probable nondisplaced sternal body fracture, acuity is uncertain, may be acute.   Electronically Signed   By: Rubye OaksMelanie  Ehinger M.D.   On: February 07, 2015 23:48   Dg Chest Port 1 View  01/09/2015   CLINICAL DATA:  Endotracheal and enteric tube placements.  EXAM: PORTABLE CHEST - 1 VIEW  COMPARISON:  01/17/2015  FINDINGS: Of endotracheal tube is been placed with tip measuring 5.4 cm above the carinal. An enteric tube was placed with tip off the field of view but below the left hemidiaphragm. Shallow inspiration. Cardiac enlargement without significant pulmonary vascular congestion. No focal airspace consolidation in the lungs. No blunting of costophrenic angles. No pneumothorax.  IMPRESSION: Appliances appear in satisfactory  position. Cardiac enlargement. No focal consolidation in the lungs appear   Electronically Signed   By: Burman NievesWilliam  Stevens M.D.   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 Orders placed 2/25 Pt;s family at bedside. Support given to them  If feasible then we can consider transfer to private room. I suspect that she will die in the ICU  Levy Pupaobert Attie Nawabi, MD, PhD 01/16/2015, 4:38 PM [facility] [phone] or if no answer [phone]

## 2015-01-28 NOTE — Progress Notes (Addendum)
Patient expired at 16:40, absent breath sounds and heart tones verified by 2 RN's, Anderson MaltaJessica M and Lesley W.  Dr. Delton CoombesByrum notified and support given to patient's family. [place], Mitzi HansenJessica Marie

## 2015-01-28 NOTE — Progress Notes (Signed)
Patient extubated per MD order with withdrawal of life sustaining support. RN at bedside. Family at bedside also.

## 2015-01-28 DEATH — deceased

## 2015-02-18 NOTE — Discharge Summary (Addendum)
PULMONARY / CRITICAL CARE MEDICINE   Name: Patricia Mejia MRN: [id number] DOB: 10/13/1961    ADMISSION DATE:  2015-01-20 DATE OF DEATH: 12/31/2014  FINAL CAUSE OF DEATH:  Anoxic brain injury  SECONDARY CAUSES OF DEATH:  Cardiac arrest with 40-50 minutes of CPR Myoclonus  Ventilator-dependent respiratory failure Aspiration of gastric contents, possible aspiration PNA GI bleeding with symptomatic anemia Acute blood loss anemia Upper intestinal angiectasia Hypertension Chronic diastolic CHF Alcohol abuse Tobacco abuse    HOSPITAL COURSE:  54 y.o. F [facility] in PEA arrest. ROSC roughly 40 - 50 minutes. She has a history of hypertension, chronic diastolic CHF, alcohol abuse, tobacco abuse. She was originally admitted on 01/17/15 for dyspnea and fatigue. She was found to have significant microcytic anemia with a hemoglobin of 7.9.  She underwent endoscopy and colonoscopy on 2/22 data verified some small polyps, ileocecal valvular ulceration, and upper intestinal angiectasias. She was discharged in stable condition but unfortunately returned on 2/23 after CPR as mentioned above. CT-PA showed no evidence of a pulmonary embolism but there was evidence of aspiration. A head CT performed 2/22 showed subtle loss of the gray-white junction. She was not deemed a candidate for hypothermia given her prolonged downtime in her recent GI bleeding. She stabilized hemodynamically on mechanical ventilation. Unfortunately there was clear evidence of severe anoxic brain injury with myoclonus. Discussions were undertaken with the patient's daughter and other family regarding prognosis. Given the low likelihood for meaningful survival decision was made to withdraw mechanical ventilation and extraordinary support on 01/21/2015. She died later that same day.    STUDIES:  Colonoscopy 2/22 >>> external hemorrhoids, 8mm cecal and 6mm sigmoid polyps removed with hot snare. 3mm rectal polyp removed and biopsied.   No diverticula.  Shallow well defined ulceration along IC valve, biopsies obtained. CXR 2/22 >>> widened mediastinum CTA chest 2/22 >>> no PE, tracheal debris into R main and RLL airway CT head 2/22 >>> some subtle loss of the gray-white junction    Levy Pupaobert Kambra Beachem, MD, PhD 02/18/2015, 4:13 PM [place] [facility] [phone] or if no answer [phone]

## 2016-10-26 IMAGING — CT CT ANGIO CHEST
1 of 8 series · 17 of 36 positions shown · IV contrast (Iohexol (Omnipaque 350))
Comparison: Chest radiograph earlier this day.

CLINICAL DATA: Cardiac arrest.

EXAM:
CT ANGIOGRAPHY CHEST WITH CONTRAST
TECHNIQUE: Multidetector CT imaging of the chest was performed using the
standard protocol during bolus administration of intravenous
contrast. Multiplanar CT image reconstructions and MIPs were
obtained to evaluate the vascular anatomy.
CONTRAST:  100mL OMNIPAQUE IOHEXOL 350 MG/ML SOLN

[Series 507: thins pacs · axial · 0.67mm/px · z∈[-44,+178]mm · 17 of 250 slices shown]
[im 14/250  lung]
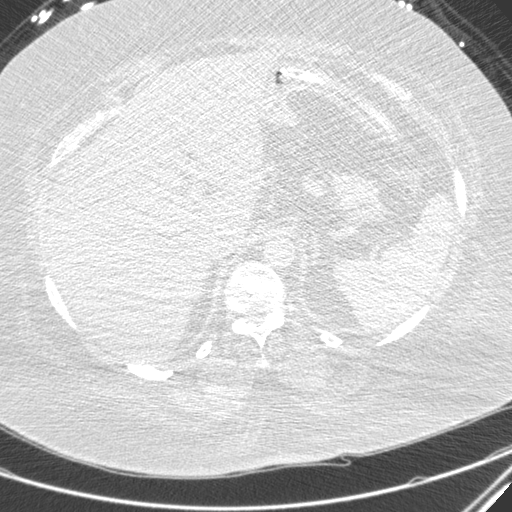
[im 28/250  mediastinal]
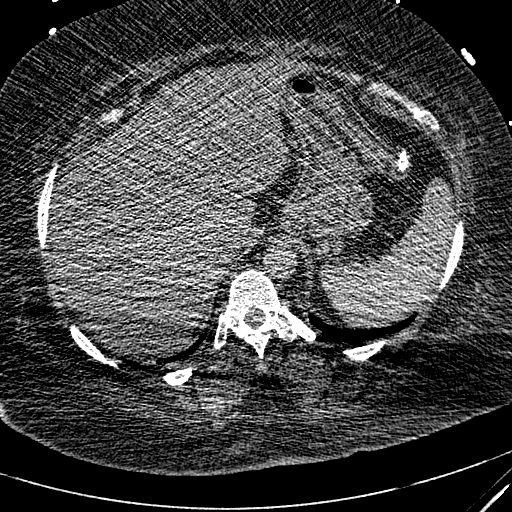
[im 42/250  lung]
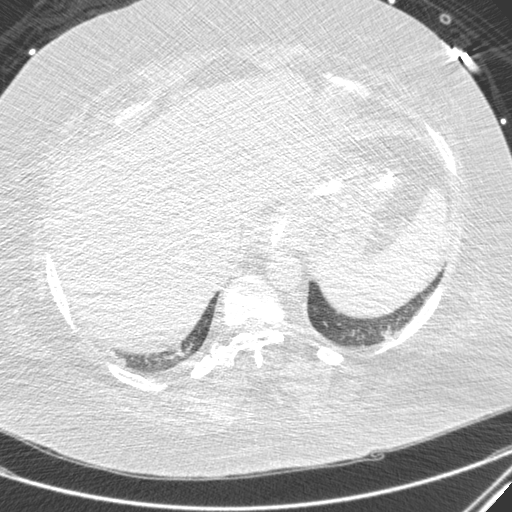
[im 56/250  mediastinal]
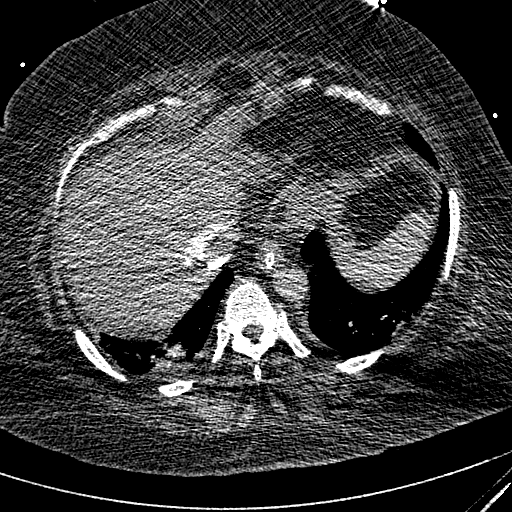
[im 70/250  lung]
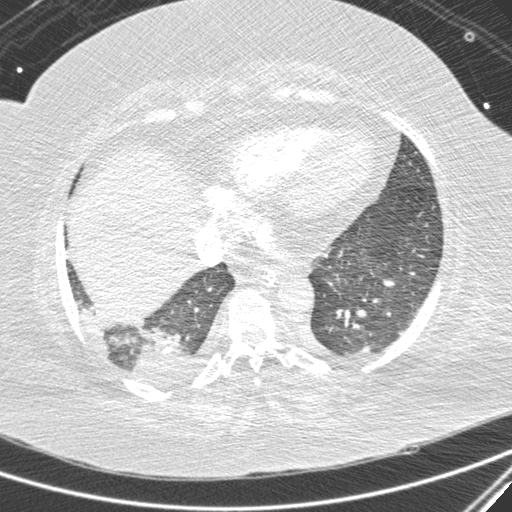
[im 84/250  mediastinal]
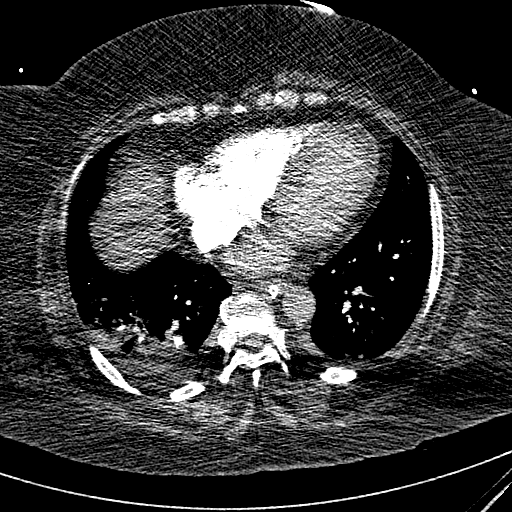
[im 97/250  lung]
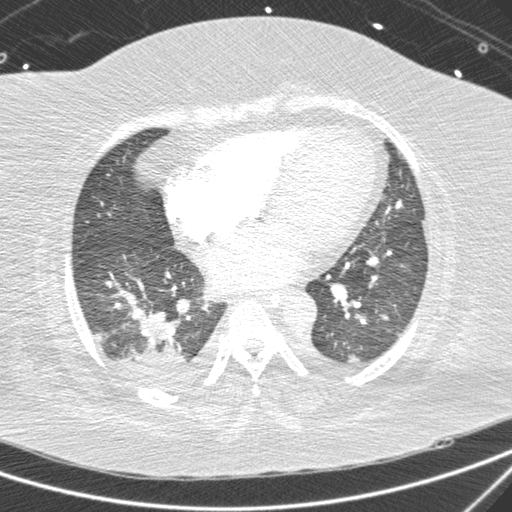
[im 111/250  mediastinal]
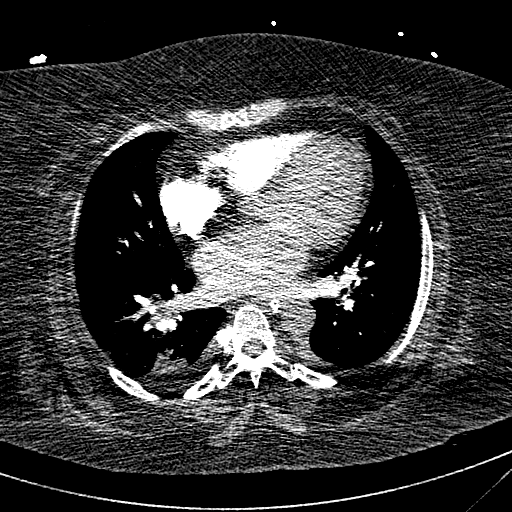
[im 125/250  lung]
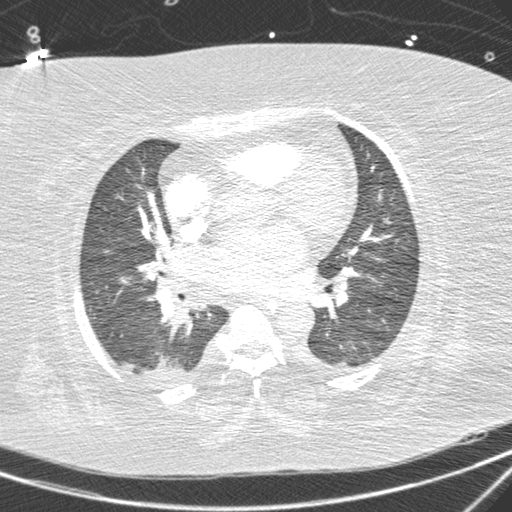
[im 139/250  mediastinal]
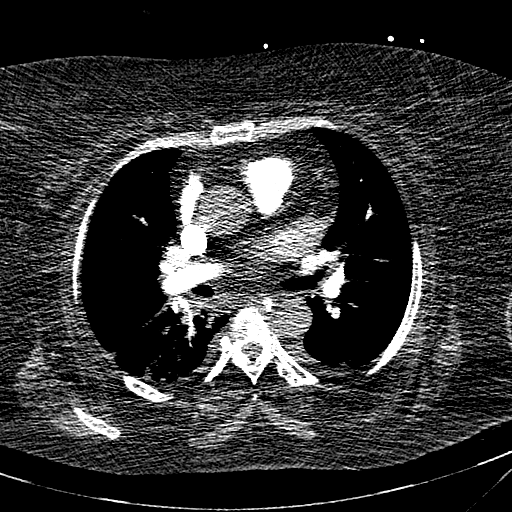
[im 153/250  lung]
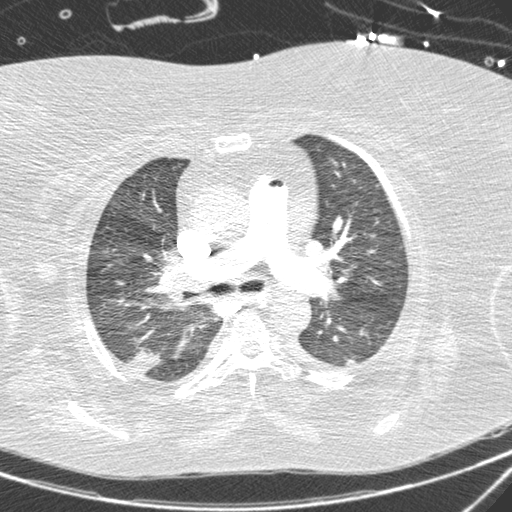
[im 167/250  mediastinal]
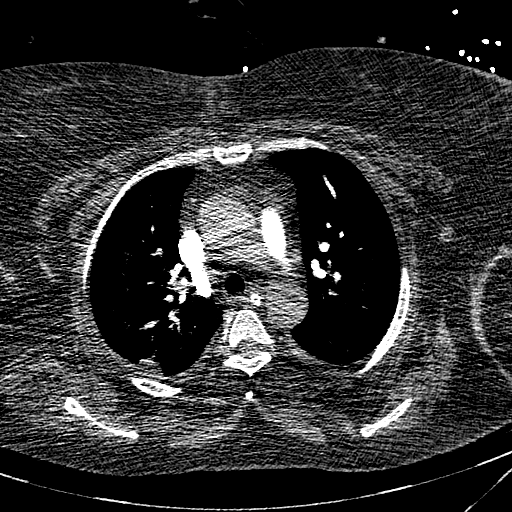
[im 180/250  lung]
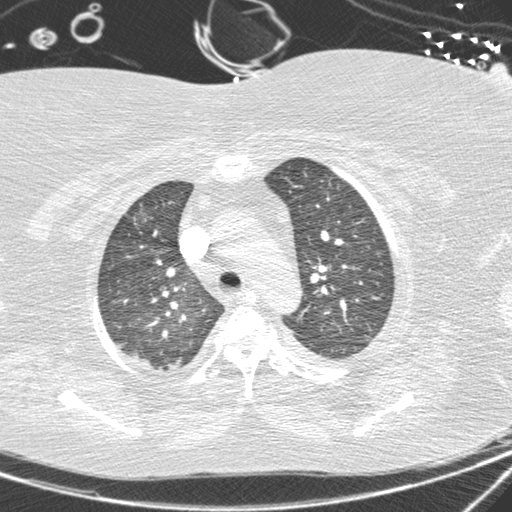
[im 194/250  mediastinal]
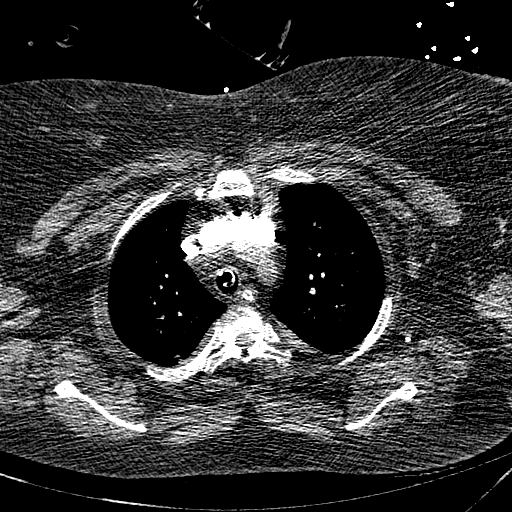
[im 208/250  lung]
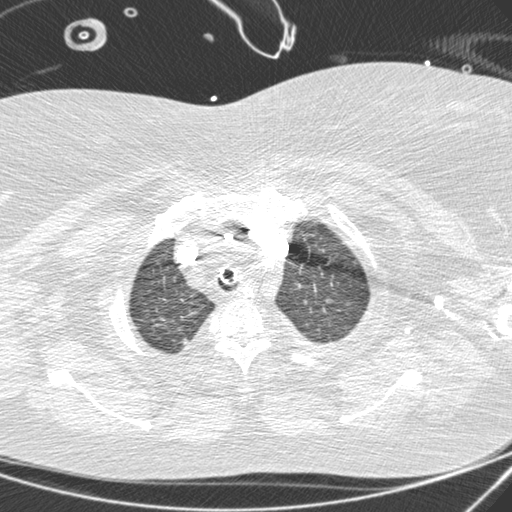
[im 222/250  mediastinal]
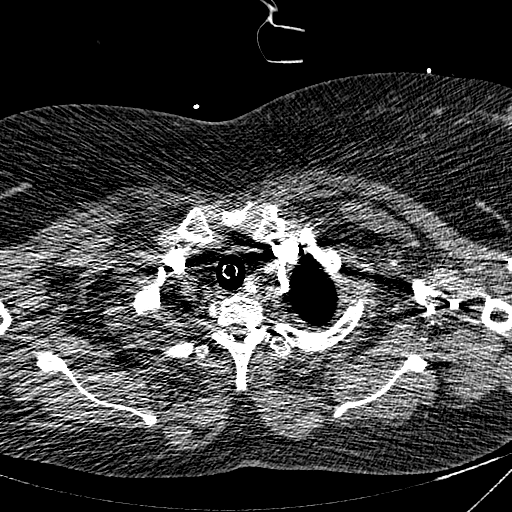
[im 236/250  lung]
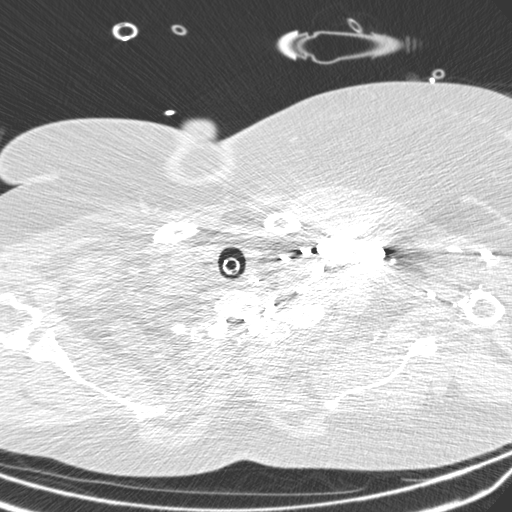

[17 of 36 positions shown; findings below may reference images not displayed]

FINDINGS: There are no filling defects within the pulmonary arteries to
suggest pulmonary embolus.

Endotracheal tube in the midtrachea. Enteric tube in place, tip and
side port in the stomach.

There is multi chamber cardiomegaly. Contrast refluxes into the IVC
and hepatic veins. There is aneurysmal dilatation of the ascending
aorta up to 3.8 cm. There is mild mediastinal adenopathy. For
example a right paratracheal lymph node measures 15 mm in short axis
dimension. Prominent prevascular nodes measuring up to 7 mm short
axis dimension. Prominent right hilar lymph node measures 13 mm.
There is a small right pleural effusion.

Mild dependent debris in the trachea just distal to the endotracheal
tube and in the right mainstem bronchus. There is debris in the
right lower lobe bronchus causing right lower lobe atelectasis. Mild
dependent atelectasis at the left lung base. No pericardial
effusion.

Evaluation of the upper abdomen demonstrates contrast refluxing into
the IVC and hepatic veins. No additional acute abnormality.

Probable nondisplaced sternal body fracture. No definite rib
fracture. There is degenerative change in the spine.

Review of the MIP images confirms the above findings.
IMPRESSION: 1. No pulmonary embolus.
2. Debris within the trachea, right mainstem and right lower lobe
bronchus with associated right basilar atelectasis, question
aspiration. Small associated right pleural effusion. Minimal
atelectasis at the left lung base.
3. Cardiomegaly with contrast refluxing into the IVC and hepatic
veins, consistent with right heart failure.
4. Probable nondisplaced sternal body fracture, acuity is uncertain,
may be acute.
